# Patient Record
Sex: Male | Born: 1980 | Hispanic: Yes | Marital: Single | Smoking: Current some day smoker
Health system: Southern US, Community
[De-identification: ages and names within clinical notes are randomized; demographics above are authoritative.]

## PROBLEM LIST (undated history)

## (undated) DIAGNOSIS — F32A Depression, unspecified: Secondary | ICD-10-CM

## (undated) DIAGNOSIS — F419 Anxiety disorder, unspecified: Secondary | ICD-10-CM

## (undated) DIAGNOSIS — F329 Major depressive disorder, single episode, unspecified: Secondary | ICD-10-CM

## (undated) DIAGNOSIS — K219 Gastro-esophageal reflux disease without esophagitis: Secondary | ICD-10-CM

## (undated) HISTORY — DX: Gastro-esophageal reflux disease without esophagitis: K21.9

## (undated) HISTORY — DX: Depression, unspecified: F32.A

## (undated) HISTORY — DX: Anxiety disorder, unspecified: F41.9

## (undated) HISTORY — DX: Major depressive disorder, single episode, unspecified: F32.9

---

## 2014-07-19 ENCOUNTER — Emergency Department (HOSPITAL_COMMUNITY): Payer: Self-pay

## 2014-07-19 ENCOUNTER — Emergency Department (HOSPITAL_COMMUNITY)
Admission: EM | Admit: 2014-07-19 | Discharge: 2014-07-19 | Discharge status: Against Medical Advice | Payer: Self-pay | Attending: Emergency Medicine | Admitting: Emergency Medicine

## 2014-07-19 ENCOUNTER — Encounter (HOSPITAL_COMMUNITY): Payer: Self-pay | Admitting: Emergency Medicine

## 2014-07-19 DIAGNOSIS — F1010 Alcohol abuse, uncomplicated: Secondary | ICD-10-CM | POA: Insufficient documentation

## 2014-07-19 DIAGNOSIS — D696 Thrombocytopenia, unspecified: Secondary | ICD-10-CM | POA: Insufficient documentation

## 2014-07-19 DIAGNOSIS — F1410 Cocaine abuse, uncomplicated: Secondary | ICD-10-CM | POA: Insufficient documentation

## 2014-07-19 DIAGNOSIS — F1020 Alcohol dependence, uncomplicated: Secondary | ICD-10-CM | POA: Diagnosis present

## 2014-07-19 LAB — ACETAMINOPHEN LEVEL: Acetaminophen (Tylenol), Serum: <10 ug/mL — ABNORMAL LOW (ref 10–30)

## 2014-07-19 LAB — URINE MICROSCOPIC-ADD ON

## 2014-07-19 LAB — RAPID URINE DRUG SCREEN, HOSP PERFORMED
Amphetamines: NOT DETECTED
BENZODIAZEPINES: NOT DETECTED
Barbiturates: NOT DETECTED
COCAINE: POSITIVE — AB
Opiates: NOT DETECTED
TETRAHYDROCANNABINOL: NOT DETECTED

## 2014-07-19 LAB — COMPREHENSIVE METABOLIC PANEL
ALK PHOS: 117 U/L (ref 38–126)
ALT: 49 U/L (ref 17–63)
AST: 168 U/L — AB (ref 15–41)
Albumin: 3.6 g/dL (ref 3.5–5.0)
Anion gap: 10 (ref 5–15)
BILIRUBIN TOTAL: 1.4 mg/dL — AB (ref 0.3–1.2)
BUN: 6 mg/dL (ref 6–20)
CO2: 25 mmol/L (ref 22–32)
Calcium: 8.1 mg/dL — ABNORMAL LOW (ref 8.9–10.3)
Chloride: 103 mmol/L (ref 101–111)
Creatinine, Ser: 0.59 mg/dL — ABNORMAL LOW (ref 0.61–1.24)
GFR calc Af Amer: >60 mL/min (ref >60)
GFR calc non Af Amer: >60 mL/min (ref >60)
GLUCOSE: 105 mg/dL — AB (ref 70–99)
POTASSIUM: 3.5 mmol/L (ref 3.5–5.1)
SODIUM: 138 mmol/L (ref 135–145)
Total Protein: 8.7 g/dL — ABNORMAL HIGH (ref 6.5–8.1)

## 2014-07-19 LAB — CBC WITH DIFFERENTIAL/PLATELET
Basophils Absolute: 0.1 10*3/uL (ref 0.0–0.1)
Basophils Relative: 1 % (ref 0–1)
Eosinophils Absolute: 0.2 10*3/uL (ref 0.0–0.7)
Eosinophils Relative: 3 % (ref 0–5)
HCT: 35.3 % — ABNORMAL LOW (ref 39.0–52.0)
Hemoglobin: 11.4 g/dL — ABNORMAL LOW (ref 13.0–17.0)
Lymphocytes Relative: 22 % (ref 12–46)
Lymphs Abs: 1.3 10*3/uL (ref 0.7–4.0)
MCH: 23.2 pg — ABNORMAL LOW (ref 26.0–34.0)
MCHC: 32.3 g/dL (ref 30.0–36.0)
MCV: 71.9 fL — ABNORMAL LOW (ref 78.0–100.0)
MONOS PCT: 12 % (ref 3–12)
Monocytes Absolute: 0.7 10*3/uL (ref 0.1–1.0)
Neutro Abs: 3.4 10*3/uL (ref 1.7–7.7)
Neutrophils Relative %: 62 % (ref 43–77)
PLATELETS: 26 10*3/uL — AB (ref 150–400)
RBC: 4.91 MIL/uL (ref 4.22–5.81)
RDW: 17.4 % — AB (ref 11.5–15.5)
WBC: 5.7 10*3/uL (ref 4.0–10.5)

## 2014-07-19 LAB — ETHANOL: Alcohol, Ethyl (B): 316 mg/dL (ref <5)

## 2014-07-19 LAB — URINALYSIS, ROUTINE W REFLEX MICROSCOPIC
Glucose, UA: NEGATIVE mg/dL
HGB URINE DIPSTICK: NEGATIVE
KETONES UR: NEGATIVE mg/dL
LEUKOCYTES UA: NEGATIVE
Nitrite: NEGATIVE
PH: 6.5 (ref 5.0–8.0)
PROTEIN: 100 mg/dL — AB
SPECIFIC GRAVITY, URINE: 1.024 (ref 1.005–1.030)
Urobilinogen, UA: 2 mg/dL — ABNORMAL HIGH (ref 0.0–1.0)

## 2014-07-19 LAB — PROTIME-INR
INR: 1.41 (ref 0.00–1.49)
PROTHROMBIN TIME: 17.4 s — AB (ref 11.6–15.2)

## 2014-07-19 LAB — APTT: aPTT: 38 seconds — ABNORMAL HIGH (ref 24–37)

## 2014-07-19 LAB — LIPASE, BLOOD: LIPASE: 41 U/L (ref 22–51)

## 2014-07-19 LAB — SALICYLATE LEVEL

## 2014-07-19 MED ORDER — IBUPROFEN 200 MG PO TABS: 600.0000 mg | ORAL_TABLET | Freq: Three times a day (TID) | ORAL | Status: DC | PRN

## 2014-07-19 MED ORDER — ALUM & MAG HYDROXIDE-SIMETH 200-200-20 MG/5ML PO SUSP: 30.0000 mL | ORAL | Status: DC | PRN

## 2014-07-19 MED ORDER — ZOLPIDEM TARTRATE 5 MG PO TABS: 5.0000 mg | ORAL_TABLET | Freq: Every evening | ORAL | Status: DC | PRN

## 2014-07-19 MED ORDER — LORAZEPAM 1 MG PO TABS: 0.0000 mg | ORAL_TABLET | Freq: Four times a day (QID) | ORAL | Status: DC

## 2014-07-19 MED ORDER — THIAMINE HCL 100 MG/ML IJ SOLN
Freq: Once | INTRAVENOUS | Status: AC
  Administered 2014-07-19: 10:00:00 via INTRAVENOUS
  Filled 2014-07-19: qty 1000

## 2014-07-19 MED ORDER — LORAZEPAM 1 MG PO TABS: 0.0000 mg | ORAL_TABLET | Freq: Two times a day (BID) | ORAL | Status: DC

## 2014-07-19 MED ORDER — ONDANSETRON HCL 4 MG PO TABS: 4.0000 mg | ORAL_TABLET | Freq: Three times a day (TID) | ORAL | Status: DC | PRN

## 2014-07-19 MED ORDER — NICOTINE 21 MG/24HR TD PT24: 21.0000 mg | MEDICATED_PATCH | Freq: Every day | TRANSDERMAL | Status: DC

## 2014-07-19 NOTE — ED Notes (Signed)
RN notified of critical ETOH of 316.

## 2014-07-19 NOTE — Discharge Instructions (Signed)
Su recuento de Programme researcher, broadcasting/film/videoplaquetas hoy en da es muy baja , es probable que la causa de la totalidad de su sangrado . Es [employer] importante que siga en marcha para una evaluacin adicional de [place] sangrado . Por favor, pngase en contacto con el Dr. Bertis RuddyGorsuch en [address] [place] [id number] [phone]  Si las heces se ve oscura , sangrienta , tiene dolor abdominal o trauma en la cabeza debe venir inmediatamente a la sala de emergencias para una evaluacin adicional ya que esto puede resultar en una prdida de sangre que amenaza la vida .  Your platelet count today is very low, this is likely the cause of all of your bleeding. It is very important that you follow-up for further evaluation of this bleeding. Please contact Dr. Bertis RuddyGorsuch at [address] [place] [phone]  if your stool looks dark,bloody, you have any abdominal pain or head trauma you must immediately come to the emergency room for further evaluation as this may result in a life-threatening blood loss.

## 2014-07-19 NOTE — BH Assessment (Signed)
Assessment Note  Max FettersJuan Torres Lin is an 34 y.o. male complaining of increased anxiety. Patient reports that this morning, "I felt desperate". Sts that he started to throw things b/c, "Something is wrong in my head". Patient describes symptoms as fast heart beats, weak legs, memory loss, and nervousness around large crowds of people. He reports a similar episode 1 [place] ago in [place]. Sts that he was working and suddenly started to feel anxious and desperate. Patient stating that he feels suicidal due to this overwhelming feeling of anxiety and desperation. He doesn't feel safe to leave the hospital requesting to be further checked out medically. Patient feels stressed stating, "My bladder hurts and I bleed when I have a stool". Patient reports feeling depressed evidenced by loss of interest in usual pleasures, fatigue, isolating self from others, and hopelessness. Patient denies HI and AVH's. Although, he denies AVH's he has experienced AH's in the past (last incident was 6 months ago). He does not have any current mental health outpatient providers. He also does not have a history of inpatient mental health treatment. Patient has a close friend that he considers to be his support system. He lives with 2 roommates. He denies history of family members with mental illness. No history of abuse.  Axis I: Anxiety Disorder NOS and Depressive Disorder NOS Axis II: Deferred Axis III: History reviewed. No pertinent past medical history. Axis IV: other psychosocial or environmental problems, problems related to social environment, problems with access to health care services and problems with primary support group Axis V: 31-40 impairment in reality testing  Past Medical History: History reviewed. No pertinent past medical history.  History reviewed. No pertinent past surgical history.  Family History: No family history on file.  Social History:  reports that he drinks alcohol. He reports that he uses  illicit drugs (Cocaine). His tobacco history is not on file.  Additional Social History:  Alcohol / Drug Use Pain Medications: SEE MAR Prescriptions: SEE MAR Over the Counter: SEE MAR History of alcohol / drug use?: Yes Substance #1 Name of Substance 1: Alcohol  1 - Age of First Use: 34 yrs old  1 - Amount (size/oz): (4) 24 oz beers 1 - Frequency: daily  1 - Duration: on-going  1 - Last Use / Amount: last night 07/20/2014 (12 beers) Substance #2 Name of Substance 2: Cocaine  2 - Age of First Use: 34 yrs old  2 - Amount (size/oz): 1 gram 2 - Frequency: "Weekends" 2 - Duration: on-going  2 - Last Use / Amount: 07/19/2014  CIWA: CIWA-Ar BP: (!) 120/54 mmHg Pulse Rate: 93 Nausea and Vomiting: 2 Tactile Disturbances: none Tremor: no tremor Auditory Disturbances: not present Paroxysmal Sweats: no sweat visible Visual Disturbances: not present Anxiety: no anxiety, at ease Headache, Fullness in Head: none present Agitation: normal activity Orientation and Clouding of Sensorium: oriented and can do serial additions CIWA-Ar Total: 2 COWS:    Allergies: No Known Allergies  Home Medications:  (Not in a hospital admission)  OB/GYN Status:  No LMP for male patient.  General Assessment Data Location of Assessment: [facility] Is this a Tele or Face-to-Face Assessment?: Face-to-Face Is this an Initial Assessment or a Re-assessment for this encounter?: Initial Assessment Marital status: Divorced DavyMaiden name:  (n/a) Is patient pregnant?: No Pregnancy Status: No Living Arrangements: Non-relatives/Friends, Other (Comment) ("I live with 2 friends") Can pt return to current living arrangement?: Yes Admission Status: Voluntary Is patient capable of signing voluntary admission?: Yes Referral Source:  Self/Family/Friend Insurance type:  (Self Pay )  Medical Screening Exam [facility] Walk-in ONLY) Medical Exam completed:  (n/a)  Crisis Care Plan Living Arrangements: Non-relatives/Friends, Other  (Comment) ("I live with 2 friends") Name of Psychiatrist:  (patient denies ) Name of Therapist:  (patient denies )  Education Status Is patient currently in school?: No Current Grade:  (n/a) Highest grade of school patient has completed:  (n/a) Name of school:  (n/a) Contact person:  (n/a)  Risk to self with the past 6 months Suicidal Ideation: Yes-Currently Present Has patient been a risk to self within the past 6 months prior to admission? : No Suicidal Intent: No Has patient had any suicidal intent within the past 6 months prior to admission? : No Is patient at risk for suicide?: No Suicidal Plan?: No Has patient had any suicidal plan within the past 6 months prior to admission? : No Access to Means: No What has been your use of drugs/alcohol within the last 12 months?:  (patient reports cocaine and alcohol ) Previous Attempts/Gestures: No How many times?:  (0) Other Self Harm Risks:  (n/a) Triggers for Past Attempts: Other (Comment) (no previous attempts or gestures to harm self ) Intentional Self Injurious Behavior: None Family Suicide History: No Recent stressful life event(s): Other (Comment) ("My stomach hurts and I bleed when I use the bathroom") Persecutory voices/beliefs?: No Depression: Yes Depression Symptoms: Feeling angry/irritable, Feeling worthless/self pity, Guilt, Loss of interest in usual pleasures, Fatigue, Isolating, Tearfulness, Insomnia, Despondent Substance abuse history and/or treatment for substance abuse?: No Suicide prevention information given to non-admitted patients: Not applicable  Risk to Others within the past 6 months Homicidal Ideation: No Does patient have any lifetime risk of violence toward others beyond the six months prior to admission? : No Thoughts of Harm to Others: No Current Homicidal Intent: No Current Homicidal Plan: No Access to Homicidal Means: No Identified Victim:  (n/a) History of harm to others?: No Assessment of  Violence: None Noted Violent Behavior Description:  (patient calm and cooperative ) Does patient have access to weapons?: No Criminal Charges Pending?: No Does patient have a court date: No Is patient on probation?: No  Psychosis Hallucinations: Auditory (patient has a history  of auditory-last incident 6 mo's ago ) Delusions: None noted  Mental Status Report Appearance/Hygiene: Disheveled Eye Contact: Good Motor Activity: Freedom of movement Speech: Logical/coherent Level of Consciousness: Alert Mood: Depressed Affect: Appropriate to circumstance Anxiety Level: None Thought Processes: Relevant Judgement: Impaired Orientation: Person, Place, Time, Situation Obsessive Compulsive Thoughts/Behaviors: None  Cognitive Functioning Concentration: Normal Memory: Remote Intact, Recent Intact IQ: Average Insight: Good Impulse Control: Fair Appetite: Poor Weight Loss:  (pt admits to wt. loss but unable to provide a #) Weight Gain:  (none reported ) Sleep: Decreased Total Hours of Sleep:  ("I'm not getting any sleep") Vegetative Symptoms: None  ADLScreening [facility] Assessment Services) Patient's cognitive ability adequate to safely complete daily activities?: Yes Patient able to express need for assistance with ADLs?: Yes Independently performs ADLs?: Yes (appropriate for developmental age)  Prior Inpatient Therapy Prior Inpatient Therapy: No Prior Therapy Dates:  (n/a) Prior Therapy Facilty/Provider(s):  (n/a) Reason for Treatment:  (n/a)  Prior Outpatient Therapy Prior Outpatient Therapy: No Prior Therapy Dates:  (n/a) Prior Therapy Facilty/Provider(s):  (n/a) Reason for Treatment:  (n/a) Does patient have an ACCT team?: No Does patient have Intensive In-House Services?  : No Does patient have [facility] services? : No Does patient have P4CC services?: No  ADL Screening (condition  at time of admission) Patient's cognitive ability adequate to safely complete daily  activities?: Yes Is the patient deaf or have difficulty hearing?: No Does the patient have difficulty seeing, even when wearing glasses/contacts?: No Does the patient have difficulty concentrating, remembering, or making decisions?: No Patient able to express need for assistance with ADLs?: Yes Does the patient have difficulty dressing or bathing?: No Independently performs ADLs?: Yes (appropriate for developmental age) Does the patient have difficulty walking or climbing stairs?: No Weakness of Legs: None Weakness of Arms/Hands: None  Home Assistive Devices/Equipment Home Assistive Devices/Equipment: None    Abuse/Neglect Assessment (Assessment to be complete while patient is alone) Physical Abuse: Denies Verbal Abuse: Denies Sexual Abuse: Denies Exploitation of patient/patient's resources: Denies Self-Neglect: Denies Values / Beliefs Cultural Requests During Hospitalization: None Spiritual Requests During Hospitalization: None        Additional Information 1:1 In Past 12 Months?: No CIRT Risk: No Elopement Risk: No Does patient have medical clearance?: Yes     Disposition:  Disposition Initial Assessment Completed for this Encounter: Yes Disposition of Patient: Other dispositions (Pending psychiatric evaluation )  On Site Evaluation by:   Reviewed with Physician:    Octaviano BattyPerry, Kristeen Lantz Mona 07/19/2014 10:26 AM

## 2014-07-19 NOTE — Progress Notes (Signed)
Spanish interpreter to assist with psych evaluation  Between 930 and 10am.   Olga CoasterKristen Brysyn Brandenberger, LCSW  Clinical Social Work  [facility] [phone]

## 2014-07-19 NOTE — ED Notes (Signed)
Bed: WA20 Expected date:  Expected time:  Means of arrival:  Comments: headache

## 2014-07-19 NOTE — ED Notes (Signed)
Went in to check in room on pt and pt was not in room, tech said pt went to restroom before. Pt had no belongings in room, gown and paper scrubs left in room on bedside table. Search around ED, lobby and outside. Pt no where to be found. Security and off duty [employer] made aware as well as Press photographercharge nurse and AD. Pt still had IV in place when in room and no IV found in room

## 2014-07-19 NOTE — ED Notes (Signed)
RN and PA notified of critical Platelet count of 26.

## 2014-07-19 NOTE — ED Provider Notes (Signed)
CSN: [id number]     Arrival date & time 07/19/14  0800 History   First MD Initiated Contact with Patient 07/19/14 [phone]     Chief Complaint  Patient presents with  . Dizziness     (Consider location/radiation/quality/duration/timing/severity/associated sxs/prior Treatment) Patient is a 34 y.o. male presenting with dizziness. A language interpreter was used.  Dizziness    Lars MageJuan Raymon Muttonorres Salazar is a 34 y.o. male complaining of anxiety and vague suicidal ideation without a plan. Patient states that he had a similar episode one year ago, there is no particular life stressors there is setting this off. Patient works and he drinks a daily after work, states that he has about 324 ounce beers every day, states that he gets shaky when he does not drink that he denies any history of seizures or visual hallucinations from withdrawals. He occasionally uses cocaine. Denies prior suicide attempt, plan, homicidal ideation, auditory or visual hallucinations. States that when he gets stressed and anxious he thinks the only way to make it stop this to kill himself. Denies any history of liver issues but states that he bleeds easily, states that when he wakes up he has blood in his mouth. Denies hemoptysis and hematemesis Last drink was last night. He denies any recent head trauma.   History reviewed. No pertinent past medical history. History reviewed. No pertinent past surgical history. No family history on file. History  Substance Use Topics  . Smoking status: Not on file  . Smokeless tobacco: Not on file  . Alcohol Use: Yes    Review of Systems  10 systems reviewed and found to be negative, except as noted in the HPI.  Allergies  Review of patient's allergies indicates no known allergies.  Home Medications   Prior to Admission medications   Not on File   BP 118/70 mmHg  Pulse 70  Temp(Src) 98 F (36.7 C) (Oral)  Resp 17  SpO2 97% Physical Exam  Constitutional: He is oriented to person,  place, and time. He appears well-developed and well-nourished. No distress.  HENT:  Head: Normocephalic and atraumatic.  Mouth/Throat: Oropharynx is clear and moist.  Eyes: Conjunctivae and EOM are normal. Pupils are equal, round, and reactive to light.  Neck: Normal range of motion.  No midline C-spine  tenderness to palpation or step-offs appreciated. Patient has full range of motion without pain.   Cardiovascular: Normal rate, regular rhythm and intact distal pulses.   Pulmonary/Chest: Effort normal and breath sounds normal.  Abdominal: Soft. Bowel sounds are normal. He exhibits no distension and no mass. There is no tenderness. There is no rebound and no guarding.  Musculoskeletal: Normal range of motion.  Neurological: He is alert and oriented to person, place, and time.  Positive tongue fasciculations positive bilateral asterixis  Follows commands, Clear, goal oriented speech, Strength is 5 out of 5x4 extremities, patient ambulates with a coordinated in nonantalgic gait. Sensation is grossly intact.   Skin: Skin is warm. He is not diaphoretic.  Nursing note and vitals reviewed.   ED Course  Procedures (including critical care time) Labs Review Labs Reviewed  CBC WITH DIFFERENTIAL/PLATELET - Abnormal; Notable for the following:    Hemoglobin 11.4 (*)    HCT 35.3 (*)    MCV 71.9 (*)    MCH 23.2 (*)    RDW 17.4 (*)    Platelets 26 (*)    All other components within normal limits  COMPREHENSIVE METABOLIC PANEL - Abnormal; Notable for the following:  Glucose, Bld 105 (*)    Creatinine, Ser 0.59 (*)    Calcium 8.1 (*)    Total Protein 8.7 (*)    AST 168 (*)    Total Bilirubin 1.4 (*)    All other components within normal limits  PROTIME-INR - Abnormal; Notable for the following:    Prothrombin Time 17.4 (*)    All other components within normal limits  APTT - Abnormal; Notable for the following:    aPTT 38 (*)    All other components within normal limits  URINALYSIS,  ROUTINE W REFLEX MICROSCOPIC - Abnormal; Notable for the following:    Color, Urine AMBER (*)    Bilirubin Urine SMALL (*)    Protein, ur 100 (*)    Urobilinogen, UA 2.0 (*)    All other components within normal limits  URINE RAPID DRUG SCREEN (HOSP PERFORMED) - Abnormal; Notable for the following:    Cocaine POSITIVE (*)    All other components within normal limits  ETHANOL - Abnormal; Notable for the following:    Alcohol, Ethyl (B) 316 (*)    All other components within normal limits  ACETAMINOPHEN LEVEL - Abnormal; Notable for the following:    Acetaminophen (Tylenol), Serum <10 (*)    All other components within normal limits  LIPASE, BLOOD  SALICYLATE LEVEL  URINE MICROSCOPIC-ADD ON    Imaging Review Ct Head Wo Contrast  07/19/2014   CLINICAL DATA:  Dizziness  EXAM: CT HEAD WITHOUT CONTRAST  TECHNIQUE: Contiguous axial images were obtained from the base of the skull through the vertex without intravenous contrast.  COMPARISON:  None.  FINDINGS: No skull fracture is noted. Paranasal sinuses and mastoid air cells are unremarkable.  No intracranial hemorrhage, mass effect or midline shift. No hydrocephalus. No acute infarction. No mass lesion is noted on this unenhanced scan. The gray and white-matter differentiation is preserved. No intra or extra-axial fluid collection.  IMPRESSION: No acute intracranial abnormality.   Electronically Signed   By: Natasha MeadLiviu  Pop M.D.   On: 07/19/2014 09:17     EKG Interpretation None      MDM   Final diagnoses:  Thrombocytopenia  Alcohol abuse    Filed Vitals:   07/19/14 1015 07/19/14 1058 07/19/14 1154 07/19/14 1250  BP: 120/54 116/67 116/67 118/70  Pulse: 93 78 81 70  Temp:    98 F (36.7 C)  TempSrc:    Oral  Resp: 18 18  17   SpO2: 95% 96%  97%    Medications  LORazepam (ATIVAN) tablet 0-4 mg (0 mg Oral Not Given 07/19/14 1155)    Followed by  LORazepam (ATIVAN) tablet 0-4 mg (not administered)  LORazepam (ATIVAN) tablet 0-4 mg (0  mg Oral Duplicate 07/19/14 1157)    Followed by  LORazepam (ATIVAN) tablet 0-4 mg (not administered)  ibuprofen (ADVIL,MOTRIN) tablet 600 mg (not administered)  zolpidem (AMBIEN) tablet 5 mg (not administered)  nicotine (NICODERM CQ - dosed in mg/24 hours) patch 21 mg (not administered)  ondansetron (ZOFRAN) tablet 4 mg (not administered)  alum & mag hydroxide-simeth (MAALOX/MYLANTA) 200-200-20 MG/5ML suspension 30 mL (not administered)  sodium chloride 0.9 % 1,000 mL with thiamine 100 mg, folic acid 1 mg, multivitamins adult 10 mL, potassium chloride 20 mEq, magnesium sulfate 2 g infusion ( Intravenous New Bag/Given 07/19/14 0936)    Dennie FettersJuan Torres Salazar is a pleasant 34 y.o. male presenting with anxiety and vague suicidal ideation. Patient has no plan, no prior attempt. Elevation of AST to 168, PTT  is mildly elevated at 38. Normal INR 1.41. CBC shows a thrombocytopenia of 26,000. No significant active bleeding, head CT is negative. No anemia. Discussed with attending who recommends against transfusion, states patient can follow as an outpatient with hematology.   Alcohol level is elevated at greater than 300 however patient is not overtly intoxicated.  Patient is medically cleared for psychiatric evaluation will be transferred to the psych ED. TTS consulted, home meds and psych standard holding orders placed.   I have had an extensive discussion with this patient on his low platelet count and the importance of following up as an outpatient, patient is cleared for psychiatric evaluation, however, I have given the patient contact information for Dr. Bertis Ruddy now and also added to his discharge paperwork. We have had an extensive discussion of return precautions for trauma and GI bleeding. All questions are encouraged and answered.   Wynetta Emery, PA-C 07/19/14 1317  Vanetta Mulders, MD 07/21/14 1349

## 2014-07-19 NOTE — ED Notes (Addendum)
Per EMS: pt doe snot speak english, primarily spanish. Pt c/o dizziness, some n/v, pt also had dried blood on mouth. Pt also has ETOH on board. 4 mg of Zofran in route.

## 2014-07-20 DIAGNOSIS — Y909 Presence of alcohol in blood, level not specified: Secondary | ICD-10-CM

## 2014-07-20 DIAGNOSIS — F1020 Alcohol dependence, uncomplicated: Secondary | ICD-10-CM | POA: Diagnosis present

## 2014-07-20 NOTE — Consult Note (Signed)
[facility] Consult   Reason for Consult:  Alcohol use disorder, severe, Anxiety disorder by hx Referring Physician:  EDP Patient Identification: Max Lin MRN:  [id number] Principal Diagnosis: Alcohol use disorder, severe, dependence Diagnosis:   Patient Active Problem List   Diagnosis Date Noted  . Alcohol use disorder, severe, dependence [F10.20] 07/20/2014    Total Time spent with patient: 1 hour  Subjective:   Max Lin is a 34 y.o. male patient admitted with Alcohol use disorder, severe  HPI:  Hispanic male, 34 years old was evaluated yesterday morning for Alcohol use and intoxication.  His interview was conducted with the assistance of a Spanish interpreter.  Patient reported drinking 3 beers after work every day but drinks up to a case on week ends.  Patient also admitted to using Cocaine occasionally and his last use was the weekend.  Patient reported racing thought, lack of concentration and anxiety.  He reported previous admission ata hospital in [place] state for not concentrating, not comfortable staying around people.  At the time he was given medication to use as needed but could not remember the name.   Patient repeated touched his head as he described his lack of concentration and racing thoughts and stated that he does a lot of thinking that he cannot sleep.  He denied SI/HI/AVH.  Patient had a dirty shirt with blood on it and when asked what happened he stated that he was bleeding from his nose earlier.   Patient denied head injury in the last 6 months.  He reported good sleep and appetite.  We accepted patient for admission and will seek placement after he is cleared by the EDP  HPI Elements:   Location:  Alcohol use disorder, severe, . Quality:  severe, racing thought, anxiety, lack of concentration. Severity:  severe. Timing:  Acute. Duration:  Chronic. Context:  Brought self in seeking treatment for Alcohol intoxication..  Past Medical  History: History reviewed. No pertinent past medical history. History reviewed. No pertinent past surgical history. Family History: No family history on file. Social History:  History  Alcohol Use  . Yes     History  Drug Use  . Yes  . Special: Cocaine    History   Social History  . Marital Status: Single    Spouse Name: N/A  . Number of Children: N/A  . Years of Education: N/A   Social History Main Topics  . Smoking status: Not on file  . Smokeless tobacco: Not on file  . Alcohol Use: Yes  . Drug Use: Yes    Special: Cocaine  . Sexual Activity: Not on file   Other Topics Concern  . None   Social History Narrative  . None   Additional Social History:    Pain Medications: SEE MAR Prescriptions: SEE MAR Over the Counter: SEE MAR History of alcohol / drug use?: Yes Name of Substance 1: Alcohol  1 - Age of First Use: 34 yrs old  1 - Amount (size/oz): (4) 24 oz beers 1 - Frequency: daily  1 - Duration: on-going  1 - Last Use / Amount: last night 07/20/2014 (12 beers) Name of Substance 2: Cocaine  2 - Age of First Use: 34 yrs old  2 - Amount (size/oz): 1 gram 2 - Frequency: "Weekends" 2 - Duration: on-going  2 - Last Use / Amount: 07/19/2014                 Allergies:  No Known  Allergies  Labs:  Results for orders placed or performed during the hospital encounter of 07/19/14 (from the past 48 hour(s))  CBC with Differential     Status: Abnormal   Collection Time: 07/19/14  8:49 AM  Result Value Ref Range   WBC 5.7 4.0 - 10.5 K/uL   RBC 4.91 4.22 - 5.81 MIL/uL   Hemoglobin 11.4 (L) 13.0 - 17.0 g/dL   HCT 35.3 (L) 39.0 - 52.0 %   MCV 71.9 (L) 78.0 - 100.0 fL   MCH 23.2 (L) 26.0 - 34.0 pg   MCHC 32.3 30.0 - 36.0 g/dL   RDW 17.4 (H) 11.5 - 15.5 %   Platelets 26 (LL) 150 - 400 K/uL    Comment: SPECIMEN CHECKED FOR CLOTS REPEATED TO VERIFY PLATELET COUNT CONFIRMED BY SMEAR CRITICAL RESULT CALLED TO, READ BACK BY AND VERIFIED WITH: A.NEASE RN AT 0950 ON  05.04.16 BY SHUEA    Neutrophils Relative % 62 43 - 77 %   Lymphocytes Relative 22 12 - 46 %   Monocytes Relative 12 3 - 12 %   Eosinophils Relative 3 0 - 5 %   Basophils Relative 1 0 - 1 %   Neutro Abs 3.4 1.7 - 7.7 K/uL   Lymphs Abs 1.3 0.7 - 4.0 K/uL   Monocytes Absolute 0.7 0.1 - 1.0 K/uL   Eosinophils Absolute 0.2 0.0 - 0.7 K/uL   Basophils Absolute 0.1 0.0 - 0.1 K/uL   RBC Morphology TARGET CELLS   Comprehensive metabolic panel     Status: Abnormal   Collection Time: 07/19/14  8:49 AM  Result Value Ref Range   Sodium 138 135 - 145 mmol/L   Potassium 3.5 3.5 - 5.1 mmol/L   Chloride 103 101 - 111 mmol/L   CO2 25 22 - 32 mmol/L   Glucose, Bld 105 (H) 70 - 99 mg/dL   BUN 6 6 - 20 mg/dL   Creatinine, Ser 0.59 (L) 0.61 - 1.24 mg/dL   Calcium 8.1 (L) 8.9 - 10.3 mg/dL   Total Protein 8.7 (H) 6.5 - 8.1 g/dL   Albumin 3.6 3.5 - 5.0 g/dL   AST 168 (H) 15 - 41 U/L   ALT 49 17 - 63 U/L   Alkaline Phosphatase 117 38 - 126 U/L   Total Bilirubin 1.4 (H) 0.3 - 1.2 mg/dL   GFR calc non Af Amer >60 >60 mL/min   GFR calc Af Amer >60 >60 mL/min    Comment: (NOTE) The eGFR has been calculated using the CKD EPI equation. This calculation has not been validated in all clinical situations. eGFR's persistently <90 mL/min signify possible Chronic Kidney Disease.    Anion gap 10 5 - 15  Lipase, blood     Status: None   Collection Time: 07/19/14  8:49 AM  Result Value Ref Range   Lipase 41 22 - 51 U/L  Protime-INR     Status: Abnormal   Collection Time: 07/19/14  8:49 AM  Result Value Ref Range   Prothrombin Time 17.4 (H) 11.6 - 15.2 seconds   INR 1.41 0.00 - 1.49  APTT     Status: Abnormal   Collection Time: 07/19/14  8:49 AM  Result Value Ref Range   aPTT 38 (H) 24 - 37 seconds    Comment:        IF BASELINE aPTT IS ELEVATED, SUGGEST PATIENT RISK ASSESSMENT BE USED TO DETERMINE APPROPRIATE ANTICOAGULANT THERAPY.   Urinalysis, Routine w reflex microscopic  Status: Abnormal    Collection Time: 07/19/14  8:49 AM  Result Value Ref Range   Color, Urine AMBER (A) YELLOW    Comment: BIOCHEMICALS MAY BE AFFECTED BY COLOR   APPearance CLEAR CLEAR   Specific Gravity, Urine 1.024 1.005 - 1.030   pH 6.5 5.0 - 8.0   Glucose, UA NEGATIVE NEGATIVE mg/dL   Hgb urine dipstick NEGATIVE NEGATIVE   Bilirubin Urine SMALL (A) NEGATIVE   Ketones, ur NEGATIVE NEGATIVE mg/dL   Protein, ur 100 (A) NEGATIVE mg/dL   Urobilinogen, UA 2.0 (H) 0.0 - 1.0 mg/dL   Nitrite NEGATIVE NEGATIVE   Leukocytes, UA NEGATIVE NEGATIVE  Drug screen panel, emergency     Status: Abnormal   Collection Time: 07/19/14  8:49 AM  Result Value Ref Range   Opiates NONE DETECTED NONE DETECTED   Cocaine POSITIVE (A) NONE DETECTED   Benzodiazepines NONE DETECTED NONE DETECTED   Amphetamines NONE DETECTED NONE DETECTED   Tetrahydrocannabinol NONE DETECTED NONE DETECTED   Barbiturates NONE DETECTED NONE DETECTED    Comment:        DRUG SCREEN FOR MEDICAL PURPOSES ONLY.  IF CONFIRMATION IS NEEDED FOR ANY PURPOSE, NOTIFY LAB WITHIN 5 DAYS.        LOWEST DETECTABLE LIMITS FOR URINE DRUG SCREEN Drug Class       Cutoff (ng/mL) Amphetamine      1000 Barbiturate      200 Benzodiazepine   703 Tricyclics       500 Opiates          300 Cocaine          300 THC              50   Ethanol     Status: Abnormal   Collection Time: 07/19/14  8:49 AM  Result Value Ref Range   Alcohol, Ethyl (B) 316 (HH) <5 mg/dL    Comment:        LOWEST DETECTABLE LIMIT FOR SERUM ALCOHOL IS 11 mg/dL FOR MEDICAL PURPOSES ONLY REPEATED TO VERIFY CRITICAL RESULT CALLED TO, READ BACK BY AND VERIFIED WITH: NEESE RN AT 1006 ON 5.4.16 BY MENDOZA B   Acetaminophen level     Status: Abnormal   Collection Time: 07/19/14  8:49 AM  Result Value Ref Range   Acetaminophen (Tylenol), Serum <10 (L) 10 - 30 ug/mL    Comment:        THERAPEUTIC CONCENTRATIONS VARY SIGNIFICANTLY. A RANGE OF 10-30 ug/mL MAY BE AN EFFECTIVE CONCENTRATION  FOR MANY PATIENTS. HOWEVER, SOME ARE BEST TREATED AT CONCENTRATIONS OUTSIDE THIS RANGE. ACETAMINOPHEN CONCENTRATIONS >150 ug/mL AT 4 HOURS AFTER INGESTION AND >50 ug/mL AT 12 HOURS AFTER INGESTION ARE OFTEN ASSOCIATED WITH TOXIC REACTIONS.   Salicylate level     Status: None   Collection Time: 07/19/14  8:49 AM  Result Value Ref Range   Salicylate Lvl <9.3 2.8 - 30.0 mg/dL  Urine microscopic-add on     Status: None   Collection Time: 07/19/14  8:49 AM  Result Value Ref Range   WBC, UA 0-2 <3 WBC/hpf   RBC / HPF 0-2 <3 RBC/hpf   Urine-Other MUCOUS PRESENT     Vitals: Blood pressure 118/70, pulse 70, temperature 98 F (36.7 C), temperature source Oral, resp. rate 17, SpO2 97 %.  Risk to Self: Suicidal Ideation: Yes-Currently Present Suicidal Intent: No Is patient at risk for suicide?: No Suicidal Plan?: No Access to Means: No What has been your use of drugs/alcohol within the  last 12 months?:  (patient reports cocaine and alcohol ) How many times?:  (0) Other Self Harm Risks:  (n/a) Triggers for Past Attempts: Other (Comment) (no previous attempts or gestures to harm self ) Intentional Self Injurious Behavior: None Risk to Others: Homicidal Ideation: No Thoughts of Harm to Others: No Current Homicidal Intent: No Current Homicidal Plan: No Access to Homicidal Means: No Identified Victim:  (n/a) History of harm to others?: No Assessment of Violence: None Noted Violent Behavior Description:  (patient calm and cooperative ) Does patient have access to weapons?: No Criminal Charges Pending?: No Does patient have a court date: No Prior Inpatient Therapy: Prior Inpatient Therapy: No Prior Therapy Dates:  (n/a) Prior Therapy Facilty/Provider(s):  (n/a) Reason for Treatment:  (n/a) Prior Outpatient Therapy: Prior Outpatient Therapy: No Prior Therapy Dates:  (n/a) Prior Therapy Facilty/Provider(s):  (n/a) Reason for Treatment:  (n/a) Does patient have an ACCT team?:  No Does patient have Intensive In-House Services?  : No Does patient have [facility] services? : No Does patient have [facility] services?: No  No current facility-administered medications for this encounter.   No current outpatient prescriptions on file.    Musculoskeletal: Strength & Muscle Tone: within normal limits Gait & Station: normal Patient leans: N/A  Psychiatric Specialty Exam:     Blood pressure 118/70, pulse 70, temperature 98 F (36.7 C), temperature source Oral, resp. rate 17, SpO2 97 %.There is no height or weight on file to calculate BMI.  General Appearance: Casual  Eye Contact::  Good  Speech:  Clear and Coherent, Normal Rate and vis spanish interpreter  Volume:  Normal  Mood:  Anxious  Affect:  Congruent  Thought Process:  Coherent, Goal Directed and Intact  Orientation:  Full (Time, Place, and Person)  Thought Content:  WDL  Suicidal Thoughts:  No  Homicidal Thoughts:  No  Memory:  Immediate;   Good Recent;   Fair Remote;   Fair  Judgement:  Fair  Insight:  Fair  Psychomotor Activity:  Normal  Concentration:  Good  Recall:  NA  Fund of Knowledge:Fair  Language: via interpreter  Akathisia:  NA  Handed:  Right  AIMS (if indicated):     Assets:  Desire for Improvement  ADL's:  Intact  Cognition: WNL  Sleep:      Medical Decision Making: Review of Psycho-Social Stressors (1), Established Problem, Worsening (2) and Review of Medication Regimen & Side Effects (2)  Treatment Plan Summary: Daily contact with patient to assess and evaluate symptoms and progress in treatment, Medication management and Plan We initiated our Atival  detox protocol.  Plan:  Recommend psychiatric Inpatient admission when medically cleared. Initiate our Ativan protocol for his Alcohol detox, Patient will be re-evaluated to determine his menntal diagosis based on hx of anxiety in [place] Disposition: see above  Delfin Gant   PMHNP-BC 07/20/2014 8:52 AM Patient seen  face-to-face for psychiatric evaluation, chart reviewed and case discussed with the physician extender and developed treatment plan. Reviewed the information documented and agree with the treatment plan. Corena Pilgrim, MD

## 2014-07-22 ENCOUNTER — Emergency Department (HOSPITAL_COMMUNITY): Payer: MEDICAID

## 2014-07-22 ENCOUNTER — Inpatient Hospital Stay (HOSPITAL_COMMUNITY): Payer: MEDICAID

## 2014-07-22 ENCOUNTER — Inpatient Hospital Stay (HOSPITAL_COMMUNITY)
Admission: EM | Admit: 2014-07-22 | Discharge: 2014-07-27 | DRG: 356 | Disposition: A | Discharge status: Home / Self Care | Payer: MEDICAID | Attending: Internal Medicine | Admitting: Internal Medicine

## 2014-07-22 ENCOUNTER — Encounter (HOSPITAL_COMMUNITY): Payer: Self-pay | Admitting: *Deleted

## 2014-07-22 ENCOUNTER — Encounter (HOSPITAL_COMMUNITY)
Admission: EM | Disposition: A | Discharge status: Home / Self Care | Payer: Self-pay | Source: Home / Self Care | Attending: Pulmonary Disease

## 2014-07-22 DIAGNOSIS — M549 Dorsalgia, unspecified: Secondary | ICD-10-CM | POA: Diagnosis present

## 2014-07-22 DIAGNOSIS — Z978 Presence of other specified devices: Secondary | ICD-10-CM

## 2014-07-22 DIAGNOSIS — R42 Dizziness and giddiness: Secondary | ICD-10-CM | POA: Diagnosis present

## 2014-07-22 DIAGNOSIS — F1010 Alcohol abuse, uncomplicated: Secondary | ICD-10-CM | POA: Diagnosis present

## 2014-07-22 DIAGNOSIS — R710 Precipitous drop in hematocrit: Secondary | ICD-10-CM | POA: Diagnosis present

## 2014-07-22 DIAGNOSIS — R579 Shock, unspecified: Secondary | ICD-10-CM

## 2014-07-22 DIAGNOSIS — K922 Gastrointestinal hemorrhage, unspecified: Secondary | ICD-10-CM | POA: Diagnosis present

## 2014-07-22 DIAGNOSIS — K3189 Other diseases of stomach and duodenum: Secondary | ICD-10-CM | POA: Diagnosis present

## 2014-07-22 DIAGNOSIS — E876 Hypokalemia: Secondary | ICD-10-CM | POA: Diagnosis present

## 2014-07-22 DIAGNOSIS — H538 Other visual disturbances: Secondary | ICD-10-CM | POA: Diagnosis present

## 2014-07-22 DIAGNOSIS — R571 Hypovolemic shock: Secondary | ICD-10-CM | POA: Diagnosis present

## 2014-07-22 DIAGNOSIS — J9690 Respiratory failure, unspecified, unspecified whether with hypoxia or hypercapnia: Secondary | ICD-10-CM

## 2014-07-22 DIAGNOSIS — M25551 Pain in right hip: Secondary | ICD-10-CM | POA: Diagnosis present

## 2014-07-22 DIAGNOSIS — R072 Precordial pain: Secondary | ICD-10-CM | POA: Diagnosis present

## 2014-07-22 DIAGNOSIS — M25559 Pain in unspecified hip: Secondary | ICD-10-CM | POA: Diagnosis present

## 2014-07-22 DIAGNOSIS — K769 Liver disease, unspecified: Secondary | ICD-10-CM

## 2014-07-22 DIAGNOSIS — M545 Low back pain: Secondary | ICD-10-CM | POA: Diagnosis present

## 2014-07-22 DIAGNOSIS — D62 Acute posthemorrhagic anemia: Secondary | ICD-10-CM | POA: Diagnosis present

## 2014-07-22 DIAGNOSIS — R51 Headache: Secondary | ICD-10-CM | POA: Diagnosis present

## 2014-07-22 DIAGNOSIS — M25552 Pain in left hip: Secondary | ICD-10-CM | POA: Diagnosis present

## 2014-07-22 DIAGNOSIS — J9600 Acute respiratory failure, unspecified whether with hypoxia or hypercapnia: Secondary | ICD-10-CM | POA: Diagnosis present

## 2014-07-22 DIAGNOSIS — K7010 Alcoholic hepatitis without ascites: Secondary | ICD-10-CM | POA: Diagnosis present

## 2014-07-22 DIAGNOSIS — Z452 Encounter for adjustment and management of vascular access device: Secondary | ICD-10-CM

## 2014-07-22 DIAGNOSIS — R000 Tachycardia, unspecified: Secondary | ICD-10-CM | POA: Diagnosis present

## 2014-07-22 DIAGNOSIS — R109 Unspecified abdominal pain: Secondary | ICD-10-CM

## 2014-07-22 DIAGNOSIS — J9811 Atelectasis: Secondary | ICD-10-CM | POA: Diagnosis present

## 2014-07-22 DIAGNOSIS — D696 Thrombocytopenia, unspecified: Secondary | ICD-10-CM | POA: Diagnosis present

## 2014-07-22 DIAGNOSIS — K8020 Calculus of gallbladder without cholecystitis without obstruction: Secondary | ICD-10-CM | POA: Diagnosis present

## 2014-07-22 DIAGNOSIS — M546 Pain in thoracic spine: Secondary | ICD-10-CM | POA: Diagnosis present

## 2014-07-22 DIAGNOSIS — I8501 Esophageal varices with bleeding: Principal | ICD-10-CM | POA: Diagnosis present

## 2014-07-22 DIAGNOSIS — F17210 Nicotine dependence, cigarettes, uncomplicated: Secondary | ICD-10-CM | POA: Diagnosis present

## 2014-07-22 DIAGNOSIS — K760 Fatty (change of) liver, not elsewhere classified: Secondary | ICD-10-CM | POA: Diagnosis present

## 2014-07-22 DIAGNOSIS — J90 Pleural effusion, not elsewhere classified: Secondary | ICD-10-CM | POA: Diagnosis present

## 2014-07-22 HISTORY — PX: ESOPHAGOGASTRODUODENOSCOPY: SHX5428

## 2014-07-22 LAB — CBC WITH DIFFERENTIAL/PLATELET
BASOS PCT: 1 % (ref 0–1)
Basophils Absolute: 0.1 10*3/uL (ref 0.0–0.1)
EOS ABS: 0.3 10*3/uL (ref 0.0–0.7)
Eosinophils Relative: 2 % (ref 0–5)
HCT: 27 % — ABNORMAL LOW (ref 39.0–52.0)
HEMOGLOBIN: 8.7 g/dL — AB (ref 13.0–17.0)
LYMPHS ABS: 1.8 10*3/uL (ref 0.7–4.0)
LYMPHS PCT: 14 % (ref 12–46)
MCH: 23.9 pg — ABNORMAL LOW (ref 26.0–34.0)
MCHC: 32.2 g/dL (ref 30.0–36.0)
MCV: 74.2 fL — ABNORMAL LOW (ref 78.0–100.0)
MONOS PCT: 10 % (ref 3–12)
Monocytes Absolute: 1.3 10*3/uL — ABNORMAL HIGH (ref 0.1–1.0)
NEUTROS PCT: 73 % (ref 43–77)
Neutro Abs: 9.1 10*3/uL — ABNORMAL HIGH (ref 1.7–7.7)
Platelets: 46 10*3/uL — ABNORMAL LOW (ref 150–400)
RBC: 3.64 MIL/uL — ABNORMAL LOW (ref 4.22–5.81)
RDW: 17.5 % — AB (ref 11.5–15.5)
WBC: 12.6 10*3/uL — AB (ref 4.0–10.5)

## 2014-07-22 LAB — COMPREHENSIVE METABOLIC PANEL
ALK PHOS: 109 U/L (ref 38–126)
ALT: 41 U/L (ref 17–63)
AST: 103 U/L — AB (ref 15–41)
Albumin: 2.7 g/dL — ABNORMAL LOW (ref 3.5–5.0)
Anion gap: 7 (ref 5–15)
BILIRUBIN TOTAL: 1.6 mg/dL — AB (ref 0.3–1.2)
BUN: 17 mg/dL (ref 6–20)
CALCIUM: 7.7 mg/dL — AB (ref 8.9–10.3)
CO2: 23 mmol/L (ref 22–32)
Chloride: 106 mmol/L (ref 101–111)
Creatinine, Ser: 0.89 mg/dL (ref 0.61–1.24)
GLUCOSE: 144 mg/dL — AB (ref 70–99)
Potassium: 3.7 mmol/L (ref 3.5–5.1)
Sodium: 136 mmol/L (ref 135–145)
Total Protein: 6.7 g/dL (ref 6.5–8.1)

## 2014-07-22 LAB — I-STAT CHEM 8, ED
BUN: 17 mg/dL (ref 6–20)
CALCIUM ION: 1.09 mmol/L — AB (ref 1.12–1.23)
Chloride: 102 mmol/L (ref 101–111)
Creatinine, Ser: 1 mg/dL (ref 0.61–1.24)
Glucose, Bld: 141 mg/dL — ABNORMAL HIGH (ref 70–99)
HEMATOCRIT: 32 % — AB (ref 39.0–52.0)
HEMOGLOBIN: 10.9 g/dL — AB (ref 13.0–17.0)
Potassium: 3.7 mmol/L (ref 3.5–5.1)
SODIUM: 137 mmol/L (ref 135–145)
TCO2: 20 mmol/L (ref 0–100)

## 2014-07-22 LAB — ETHANOL: Alcohol, Ethyl (B): <5 mg/dL (ref <5)

## 2014-07-22 LAB — PROTIME-INR
INR: 1.68 — ABNORMAL HIGH (ref 0.00–1.49)
PROTHROMBIN TIME: 20 s — AB (ref 11.6–15.2)

## 2014-07-22 LAB — APTT: aPTT: 32 seconds (ref 24–37)

## 2014-07-22 LAB — PREPARE RBC (CROSSMATCH)

## 2014-07-22 LAB — ABO/RH: ABO/RH(D): O POS

## 2014-07-22 SURGERY — EGD (ESOPHAGOGASTRODUODENOSCOPY)
Anesthesia: Moderate Sedation

## 2014-07-22 MED ORDER — SUCCINYLCHOLINE CHLORIDE 20 MG/ML IJ SOLN
INTRAMUSCULAR | Status: AC
  Filled 2014-07-22: qty 1

## 2014-07-22 MED ORDER — LIDOCAINE HCL (PF) 1 % IJ SOLN
INTRAMUSCULAR | Status: AC
  Filled 2014-07-22: qty 5

## 2014-07-22 MED ORDER — SODIUM CHLORIDE 0.9 % IV BOLUS (SEPSIS)
1000.0000 mL | Freq: Once | INTRAVENOUS | Status: AC
  Administered 2014-07-22: 1000 mL via INTRAVENOUS

## 2014-07-22 MED ORDER — DEXTROSE 5 % IV SOLN
2.0000 ug/min | INTRAVENOUS | Status: DC
  Administered 2014-07-22: 2 ug/min via INTRAVENOUS
  Administered 2014-07-23: 15 ug/min via INTRAVENOUS
  Filled 2014-07-22 (×2): qty 4

## 2014-07-22 MED ORDER — MIDAZOLAM HCL 5 MG/ML IJ SOLN
INTRAMUSCULAR | Status: AC
Start: 2014-07-22 — End: 2014-07-22
  Filled 2014-07-22: qty 2

## 2014-07-22 MED ORDER — PANTOPRAZOLE SODIUM 40 MG IV SOLR
80.0000 mg | Freq: Once | INTRAVENOUS | Status: AC
  Administered 2014-07-22: 80 mg via INTRAVENOUS
  Filled 2014-07-22: qty 80

## 2014-07-22 MED ORDER — SODIUM CHLORIDE 0.9 % IV SOLN
10.0000 mL/h | Freq: Once | INTRAVENOUS | Status: AC
  Administered 2014-07-22: 500 mL/h via INTRAVENOUS

## 2014-07-22 MED ORDER — SODIUM CHLORIDE 0.9 % IV SOLN
10.0000 mL/h | Freq: Once | INTRAVENOUS | Status: AC
  Administered 2014-07-22: 10 mL/h via INTRAVENOUS

## 2014-07-22 MED ORDER — FENTANYL CITRATE (PF) 100 MCG/2ML IJ SOLN
INTRAMUSCULAR | Status: AC
  Administered 2014-07-23: 150 ug
  Filled 2014-07-22: qty 6

## 2014-07-22 MED ORDER — FENTANYL CITRATE (PF) 100 MCG/2ML IJ SOLN
INTRAMUSCULAR | Status: AC
  Filled 2014-07-22: qty 2

## 2014-07-22 MED ORDER — SODIUM CHLORIDE 0.9 % IV SOLN
50.0000 ug/h | INTRAVENOUS | Status: DC
  Administered 2014-07-22 – 2014-07-26 (×9): 50 ug/h via INTRAVENOUS
  Filled 2014-07-22 (×20): qty 1

## 2014-07-22 MED ORDER — FENTANYL CITRATE (PF) 100 MCG/2ML IJ SOLN
50.0000 ug | Freq: Once | INTRAMUSCULAR | Status: AC
  Administered 2014-07-22: 50 ug via INTRAVENOUS
  Filled 2014-07-22: qty 2

## 2014-07-22 MED ORDER — DEXTROSE 5 % IV SOLN
2.0000 g | Freq: Once | INTRAVENOUS | Status: AC
  Administered 2014-07-23: 2 g via INTRAVENOUS
  Filled 2014-07-22: qty 2

## 2014-07-22 MED ORDER — ETOMIDATE 2 MG/ML IV SOLN
INTRAVENOUS | Status: AC
  Filled 2014-07-22: qty 20

## 2014-07-22 MED ORDER — LIDOCAINE HCL (CARDIAC) 20 MG/ML IV SOLN
INTRAVENOUS | Status: AC
  Filled 2014-07-22: qty 5

## 2014-07-22 MED ORDER — STERILE WATER FOR INJECTION IJ SOLN
INTRAMUSCULAR | Status: AC
  Filled 2014-07-22: qty 10

## 2014-07-22 MED ORDER — ROCURONIUM BROMIDE 50 MG/5ML IV SOLN
INTRAVENOUS | Status: AC
  Administered 2014-07-23: 10 mg
  Filled 2014-07-22: qty 2

## 2014-07-22 MED ORDER — ONDANSETRON HCL 4 MG/2ML IJ SOLN
4.0000 mg | Freq: Once | INTRAMUSCULAR | Status: AC
  Administered 2014-07-22: 4 mg via INTRAVENOUS
  Filled 2014-07-22: qty 2

## 2014-07-22 MED ORDER — MIDAZOLAM HCL 2 MG/2ML IJ SOLN
INTRAMUSCULAR | Status: AC
  Administered 2014-07-23: 4 mg
  Filled 2014-07-22: qty 8

## 2014-07-22 MED ORDER — OCTREOTIDE LOAD VIA INFUSION
50.0000 ug | Freq: Once | INTRAVENOUS | Status: DC
  Filled 2014-07-22: qty 25

## 2014-07-22 NOTE — ED Notes (Signed)
Bed: WA01 Expected date:  Expected time:  Means of arrival:  Comments: EMS 

## 2014-07-22 NOTE — ED Provider Notes (Signed)
CSN: [id number]     Arrival date & time 07/22/14  1838 History   First MD Initiated Contact with Patient 07/22/14 1852     Chief Complaint  Patient presents with  . Hematemesis  . Hypotension     (Consider location/radiation/quality/duration/timing/severity/associated sxs/prior Treatment) HPI  34 year old male presents by EMS with hematemesis. The patient is on up 3 times with blood today. The history taken through the Spanish interpreter phone. The patient is a poor historian but notes that he has been having blood in his emesis intermittently for several months but is significant worse today. The patient denies drinking every day, however he was seen here in this ER a couple days ago for alcohol detox and admitted to drinking heavily. The patient does not know of any prior history of ulcers or cirrhosis. Patient is complaining of upper abdominal pain, chest pain, and headache. He states the headache is been ongoing for the past 2 years intermittently but worse over the last few days. He's also been having blurry vision. The chest pain and abdominal pain have worsened with the vomiting. Occasionally has had melena in the past but denies any recently.  History reviewed. No pertinent past medical history. History reviewed. No pertinent past surgical history. No family history on file. History  Substance Use Topics  . Smoking status: Current Some Day Smoker  . Smokeless tobacco: Not on file  . Alcohol Use: Yes    Review of Systems  Constitutional: Negative for fever.  Eyes: Positive for visual disturbance.  Cardiovascular: Positive for chest pain.  Gastrointestinal: Positive for vomiting and abdominal pain. Negative for blood in stool.  Neurological: Positive for headaches.  All other systems reviewed and are negative.     Allergies  Review of patient's allergies indicates no known allergies.  Home Medications   Prior to Admission medications   Not on File   BP 92/46 mmHg   Pulse 89  Temp(Src) 98.1 F (36.7 C) (Oral)  Resp 18  SpO2 100% Physical Exam  Constitutional: He is oriented to person, place, and time. He appears well-developed and well-nourished.  HENT:  Head: Normocephalic and atraumatic.  Right Ear: External ear normal.  Left Ear: External ear normal.  Nose: Nose normal.  Eyes: Right eye exhibits no discharge. Left eye exhibits no discharge.  Neck: Neck supple.  Cardiovascular: Normal rate, regular rhythm, normal heart sounds and intact distal pulses.   Pulmonary/Chest: Effort normal.  Abdominal: Soft. There is no tenderness.  Musculoskeletal: He exhibits no edema.  Neurological: He is alert and oriented to person, place, and time.  Skin: Skin is warm and dry.  Nursing note and vitals reviewed.   ED Course  CENTRAL LINE Date/Time: 07/22/2014 10:12 PM Performed by: Pricilla LovelessGOLDSTON, Kharson Rasmusson Authorized by: Pricilla LovelessGOLDSTON, Tulsi Crossett Consent: Verbal consent obtained. Written consent obtained. Risks and benefits: risks, benefits and alternatives were discussed Consent given by: patient Time out: Immediately prior to procedure a "time out" was called to verify the correct patient, procedure, equipment, support staff and site/side marked as required. Indications: vascular access Anesthesia: local infiltration Local anesthetic: lidocaine 1% without epinephrine Patient sedated: no Preparation: skin prepped with ChloraPrep Skin prep agent dried: skin prep agent completely dried prior to procedure Sterile barriers: all five maximum sterile barriers used - cap, mask, sterile gown, sterile gloves, and large sterile sheet Hand hygiene: hand hygiene performed prior to central venous catheter insertion Location details: right internal jugular Patient position: Trendelenburg Catheter type: triple lumen Pre-procedure: landmarks identified Ultrasound guidance: yes Sterile  ultrasound techniques: sterile gel and sterile probe covers were used Number of attempts:  1 Successful placement: yes Post-procedure: line sutured and dressing applied Assessment: blood return through all ports,  free fluid flow,  placement verified by x-ray and no pneumothorax on x-ray Patient tolerance: Patient tolerated the procedure well with no immediate complications   (including critical care time) Labs Review Labs Reviewed  COMPREHENSIVE METABOLIC PANEL - Abnormal; Notable for the following:    Glucose, Bld 144 (*)    Calcium 7.7 (*)    Albumin 2.7 (*)    AST 103 (*)    Total Bilirubin 1.6 (*)    All other components within normal limits  CBC WITH DIFFERENTIAL/PLATELET - Abnormal; Notable for the following:    WBC 12.6 (*)    RBC 3.64 (*)    Hemoglobin 8.7 (*)    HCT 27.0 (*)    MCV 74.2 (*)    MCH 23.9 (*)    RDW 17.5 (*)    Platelets 46 (*)    Neutro Abs 9.1 (*)    Monocytes Absolute 1.3 (*)    All other components within normal limits  PROTIME-INR - Abnormal; Notable for the following:    Prothrombin Time 20.0 (*)    INR 1.68 (*)    All other components within normal limits  I-STAT CHEM 8, ED - Abnormal; Notable for the following:    Glucose, Bld 141 (*)    Calcium, Ion 1.09 (*)    Hemoglobin 10.9 (*)    HCT 32.0 (*)    All other components within normal limits  MRSA PCR SCREENING  APTT  ETHANOL  TYPE AND SCREEN  PREPARE RBC (CROSSMATCH)  PREPARE FRESH FROZEN PLASMA  ABO/RH    Imaging Review Dg Chest Port 1 View  07/22/2014   CLINICAL DATA:  Hemoptysis, hypotension, vomited a large amount of dark blood at home, weakness, smoker, appears jaundiced  EXAM: PORTABLE CHEST - 1 VIEW  COMPARISON:  Portable exam 1903 hr without priors for comparison  FINDINGS: Borderline enlargement of cardiac silhouette for technique.  Mediastinal contours and pulmonary vascularity normal.  Subsegmental atelectasis RIGHT base.  Lungs otherwise clear.  No pleural effusion or pneumothorax.  IMPRESSION: RIGHT basilar subsegmental atelectasis.   Electronically Signed   By:  Ulyses SouthwardMark  Boles M.D.   On: 07/22/2014 19:53     EKG Interpretation None      CRITICAL CARE Performed by: Pricilla LovelessGOLDSTON, Waylan Busta T   Total critical care time: 75 minutes  Critical care time was exclusive of separately billable procedures and treating other patients.  Critical care was necessary to treat or prevent imminent or life-threatening deterioration.  Critical care was time spent personally by me on the following activities: development of treatment plan with patient and/or surrogate as well as nursing, discussions with consultants, evaluation of patient's response to treatment, examination of patient, obtaining history from patient or surrogate, ordering and performing treatments and interventions, ordering and review of laboratory studies, ordering and review of radiographic studies, pulse oximetry and re-evaluation of patient's condition.  MDM   Final diagnoses:  Upper GI bleed  Shock    Patient presents with acute hematemesis. Is hypotensive on arrival, blood pressure fluctuates with fluid resuscitation. Has evidence of acute bleeding with hemoglobin drop from 3 days ago. Is not confirmed but likely has cirrhosis given chronic alcohol use and mild jaundice. Patient's abdominal exam is benign. Started on emergency release blood given the amount of fluids he is requiring. I discussed his case  with the gastroenterologist on-call, Dr. Matthias Hughs, who evaluated in ED and will scope in [facility] ICU. Dr. Vaughan Basta of ICU excepts patient in transfer. Transfer was initially delayed due to other transfers, remained hypotensive. He is currently mentating well and has no peripheral signs of poor blood flow at this time. However given his systolic blood pressure still under 80 despite a second bag of blood, a central line was placed and he was started on low-dose pressors. I viewed his chest x-ray with the deep line placement without obvious pneumothorax. I did call ICU back and let them know this will need to be  retracted when he arrived to the ICU. Patient is critically ill but stable enough for transfer at this time.    Pricilla Loveless, MD 07/23/14 0010

## 2014-07-22 NOTE — ED Notes (Signed)
Pt actively vomiting blood, Dr Criss AlvineGoldston at bedside

## 2014-07-22 NOTE — Consult Note (Signed)
Referring Provider: Dr. Pricilla LovelessScott Goldston [facility]) Primary Care Physician:  No PCP Per Patient Primary Gastroenterologist:  None (unassigned)  Reason for Consultation:  hematemesis  HPI: Max Lin is a 34 y.o. male  who presented to the emergency room at [facility] today with massive hematemesis, probably 1 L at home (by his report) plus a liter of blood witnessed to have been vomited in the emergency room. His pressures have been in the 80s systolic despite liters of IV fluid supplementation and transfusion of packed cells.  The patient does not speak English but the history is obtained primarily through an emergency room nurse who does speak Spanish. The patient has not been exposed to ulcerogenic medications but does use alcohol and cocaine. There is no prior history of significant GI bleeding.   He runs a low platelet count (3 days ago it was 26,000, today it is 46,000) suggesting that he may have cirrhosis. However, we do not have any imaging studies of his liver so far.  He was at the emergency room 3 days ago and was seen by a psychiatrist for alcohol abuse. At that time, reported intake was a couple of beers daily plus a couple of cases on the weekends. Labs suggest alcohol pattern elevation of transaminases and mild hyperbilirubinemia.  History reviewed. No pertinent past medical history.  History reviewed. No pertinent past surgical history.  Prior to Admission medications   Not on File    Current Facility-Administered Medications  Medication Dose Route Frequency Provider Last Rate Last Dose  . 0.9 %  sodium chloride infusion  10 mL/hr Intravenous Once Pricilla LovelessScott Goldston, MD      . 0.9 %  sodium chloride infusion  10 mL/hr Intravenous Once Pricilla LovelessScott Goldston, MD      . cefTRIAXone (ROCEPHIN) 2 g in dextrose 5 % 50 mL IVPB  2 g Intravenous Once Pricilla LovelessScott Goldston, MD      . norepinephrine (LEVOPHED) 4 mg in dextrose 5 % 250 mL (0.016 mg/mL) infusion  2-50 mcg/min Intravenous  Titrated Pricilla LovelessScott Goldston, MD      . octreotide (SANDOSTATIN) 500 mcg in sodium chloride 0.9 % 250 mL (2 mcg/mL) infusion  50 mcg/hr Intravenous Continuous Pricilla LovelessScott Goldston, MD 25 mL/hr at 07/22/14 1941 50 mcg/hr at 07/22/14 1941  . sterile water (preservative free) injection            No current outpatient prescriptions on file.    Allergies as of 07/22/2014  . (No Known Allergies)    No family history on file.  History   Social History  . Marital Status: Single    Spouse Name: N/A  . Number of Children: N/A  . Years of Education: N/A   Occupational History  . Not on file.   Social History Main Topics  . Smoking status: Current Some Day Smoker  . Smokeless tobacco: Not on file  . Alcohol Use: Yes  . Drug Use: Yes    Special: Cocaine  . Sexual Activity: Not on file   Other Topics Concern  . Not on file   Social History Narrative    Review of Systems: The patient reports pain in his pelvis, almost in the inguinal region bilaterally, as well as some chest pain  Physical Exam: Vital signs in last 24 hours: Temp:  [98.1 F (36.7 C)-98.5 F (36.9 C)] 98.1 F (36.7 C) (05/07 2200) Pulse Rate:  [74-110] 100 (05/07 2220) Resp:  [14-23] 21 (05/07 2225) BP: (65-107)/(28-83) 80/44 mmHg (05/07 2225)  SpO2:  [94 %-100 %] 98 % (05/07 2225)   General:   Alert,  Well-developed, well-nourished, does not appear chronically ill, is somewhat jittery or shaky but this appears to be more because he is cold than withdrawal tremors. Head:  Normocephalic and atraumatic. Eyes:  Sclera clear, hint of icterus.   Conjunctiva pink. Mouth:   No ulcerations or lesions.  Oropharynx pink & moist. Neck:   No masses or thyromegaly. Lungs:  Clear throughout to auscultation.   No wheezes, crackles, or rhonchi. No evident respiratory distress. Heart:   Regular rate and rhythm; no murmurs, clicks, rubs,  or gallops. Abdomen:  Soft, nontender, nontympanitic, and nondistended. No masses,  hepatosplenomegaly or ventral hernias noted. Normal bowel sounds, without bruits, guarding, or rebound.  I'm not able to feel the liver or the spleen. There is equivocal flank tympany, so I'm not convinced whether or not ascites is present. He does not have massive ascites, in any event. Msk:   Symmetrical without gross deformities. Pulses:  Radial pulse is a little bit thready Extremities:   Without clubbing, cyanosis, or edema. Neurologic:  Alert and coherent;  grossly normal neurologically. Skin:  Intact without significant lesions or rashes. No palmar erythema or spider angiomata. Cervical Nodes:  No significant cervical adenopathy. Psych:   Alert and cooperative. Normal mood and affect.  Intake/Output from previous day:   Intake/Output this shift: Total I/O In: 120 [Blood:120] Out: 1100 [Emesis/NG output:1100]  Lab Results:  Recent Labs  07/22/14 1921 07/22/14 1929  WBC 12.6*  --   HGB 8.7* 10.9*  HCT 27.0* 32.0*  PLT 46*  --    BMET  Recent Labs  07/22/14 1921 07/22/14 1929  NA 136 137  K 3.7 3.7  CL 106 102  CO2 23  --   GLUCOSE 144* 141*  BUN 17 17  CREATININE 0.89 1.00  CALCIUM 7.7*  --    LFT  Recent Labs  07/22/14 1921  PROT 6.7  ALBUMIN 2.7*  AST 103*  ALT 41  ALKPHOS 109  BILITOT 1.6*   PT/INR  Recent Labs  07/22/14 1921  LABPROT 20.0*  INR 1.68*    Studies/Results: Dg Chest Port 1 View  07/22/2014   CLINICAL DATA:  Hemoptysis, hypotension, vomited a large amount of dark blood at home, weakness, smoker, appears jaundiced  EXAM: PORTABLE CHEST - 1 VIEW  COMPARISON:  Portable exam 1903 hr without priors for comparison  FINDINGS: Borderline enlargement of cardiac silhouette for technique.  Mediastinal contours and pulmonary vascularity normal.  Subsegmental atelectasis RIGHT base.  Lungs otherwise clear.  No pleural effusion or pneumothorax.  IMPRESSION: RIGHT basilar subsegmental atelectasis.   Electronically Signed   By: Ulyses Southward M.D.    On: 07/22/2014 19:53    Impression: 1. Massive hematemesis with hemodynamic instability characterized by systolic pressure in the 80s despite attempted fluid resuscitation. 2. Posthemorrhagic anemia, acute 3. History of ethanol excess and cocaine exposure 4. Severe thrombocytopenia, suggestive of cirrhosis  Plan: The patient is already on PPI therapy, octreotide, and prophylactic antibiotics. I feel he should have elective endotracheal intubation to help protect the airway, after which we should do urgent endoscopy tonight to try to define the source of bleeding and, to the degree possible, prevent it from happening again. I have discussed the nature, purpose, and risks of the procedure with the patient with help of an interpreter, and he is agreeable to proceed.   LOS: 0 days   Kable Haywood V  07/22/2014, 10:38 PM  Pager [phone] If no answer or after 5 PM call [phone]

## 2014-07-22 NOTE — ED Notes (Signed)
Per EMS pt reports waking up this afternoon and throwing up blood, per EMS large amount of dark blood on scene, pt is also hypotensive and weak, he reports drinking one beer today and more so yesterday; pt appears jaundiced, denies any known  liver issues, reports epigastric pain.

## 2014-07-23 ENCOUNTER — Inpatient Hospital Stay (HOSPITAL_COMMUNITY): Payer: MEDICAID

## 2014-07-23 ENCOUNTER — Encounter (HOSPITAL_COMMUNITY): Payer: Self-pay | Admitting: Gastroenterology

## 2014-07-23 DIAGNOSIS — K922 Gastrointestinal hemorrhage, unspecified: Secondary | ICD-10-CM

## 2014-07-23 DIAGNOSIS — J9601 Acute respiratory failure with hypoxia: Secondary | ICD-10-CM

## 2014-07-23 LAB — BLOOD GAS, ARTERIAL
Acid-base deficit: 7.7 mmol/L — ABNORMAL HIGH (ref 0.0–2.0)
BICARBONATE: 18.2 meq/L — AB (ref 20.0–24.0)
Drawn by: 422461
FIO2: 1 %
MECHVT: 500 mL
O2 SAT: 100 %
PEEP: 5 cmH2O
PO2 ART: 334 mmHg — AB (ref 80.0–100.0)
Patient temperature: 98.1
RATE: 14 resp/min
TCO2: 19.5 mmol/L (ref 0–100)
pCO2 arterial: 42.3 mmHg (ref 35.0–45.0)
pH, Arterial: 7.255 — ABNORMAL LOW (ref 7.350–7.450)

## 2014-07-23 LAB — COMPREHENSIVE METABOLIC PANEL
ALT: 32 U/L (ref 17–63)
AST: 76 U/L — ABNORMAL HIGH (ref 15–41)
Albumin: 2.3 g/dL — ABNORMAL LOW (ref 3.5–5.0)
Alkaline Phosphatase: 80 U/L (ref 38–126)
Anion gap: 5 (ref 5–15)
BUN: 15 mg/dL (ref 6–20)
CALCIUM: 6.6 mg/dL — AB (ref 8.9–10.3)
CO2: 20 mmol/L — AB (ref 22–32)
CREATININE: 0.78 mg/dL (ref 0.61–1.24)
Chloride: 111 mmol/L (ref 101–111)
GFR calc Af Amer: >60 mL/min (ref >60)
GFR calc non Af Amer: >60 mL/min (ref >60)
GLUCOSE: 161 mg/dL — AB (ref 70–99)
Potassium: 5 mmol/L (ref 3.5–5.1)
Sodium: 136 mmol/L (ref 135–145)
Total Bilirubin: 2.1 mg/dL — ABNORMAL HIGH (ref 0.3–1.2)
Total Protein: 5.4 g/dL — ABNORMAL LOW (ref 6.5–8.1)

## 2014-07-23 LAB — CBC WITH DIFFERENTIAL/PLATELET
BASOS PCT: 0 % (ref 0–1)
Basophils Absolute: 0.1 10*3/uL (ref 0.0–0.1)
EOS ABS: 0 10*3/uL (ref 0.0–0.7)
EOS PCT: 0 % (ref 0–5)
HEMATOCRIT: 24.5 % — AB (ref 39.0–52.0)
Hemoglobin: 8 g/dL — ABNORMAL LOW (ref 13.0–17.0)
LYMPHS ABS: 2 10*3/uL (ref 0.7–4.0)
Lymphocytes Relative: 12 % (ref 12–46)
MCH: 24.7 pg — ABNORMAL LOW (ref 26.0–34.0)
MCHC: 32.7 g/dL (ref 30.0–36.0)
MCV: 75.6 fL — ABNORMAL LOW (ref 78.0–100.0)
MONO ABS: 1.6 10*3/uL — AB (ref 0.1–1.0)
MONOS PCT: 10 % (ref 3–12)
NEUTROS PCT: 78 % — AB (ref 43–77)
Neutro Abs: 13 10*3/uL — ABNORMAL HIGH (ref 1.7–7.7)
PLATELETS: 72 10*3/uL — AB (ref 150–400)
RBC: 3.24 MIL/uL — ABNORMAL LOW (ref 4.22–5.81)
RDW: 17.1 % — AB (ref 11.5–15.5)
WBC: 16.7 10*3/uL — AB (ref 4.0–10.5)

## 2014-07-23 LAB — PROTIME-INR
INR: 1.6 — AB (ref 0.00–1.49)
Prothrombin Time: 19.2 seconds — ABNORMAL HIGH (ref 11.6–15.2)

## 2014-07-23 LAB — CBC
HCT: 21.4 % — ABNORMAL LOW (ref 39.0–52.0)
HEMOGLOBIN: 7.1 g/dL — AB (ref 13.0–17.0)
MCH: 24.8 pg — AB (ref 26.0–34.0)
MCHC: 33.2 g/dL (ref 30.0–36.0)
MCV: 74.8 fL — ABNORMAL LOW (ref 78.0–100.0)
Platelets: 76 10*3/uL — ABNORMAL LOW (ref 150–400)
RBC: 2.86 MIL/uL — AB (ref 4.22–5.81)
RDW: 17.5 % — ABNORMAL HIGH (ref 11.5–15.5)
WBC: 21 10*3/uL — AB (ref 4.0–10.5)

## 2014-07-23 LAB — ABO/RH: ABO/RH(D): O POS

## 2014-07-23 LAB — PHOSPHORUS: Phosphorus: 4.4 mg/dL (ref 2.5–4.6)

## 2014-07-23 LAB — MRSA PCR SCREENING: MRSA by PCR: NEGATIVE

## 2014-07-23 LAB — MAGNESIUM: MAGNESIUM: 1.4 mg/dL — AB (ref 1.7–2.4)

## 2014-07-23 LAB — TRIGLYCERIDES: Triglycerides: 100 mg/dL (ref <150)

## 2014-07-23 MED ORDER — SODIUM CHLORIDE 0.9 % IV SOLN
25.0000 ug/h | INTRAVENOUS | Status: DC
  Filled 2014-07-23: qty 50

## 2014-07-23 MED ORDER — MIDAZOLAM HCL 2 MG/2ML IJ SOLN: 2.0000 mg | INTRAMUSCULAR | Status: DC | PRN

## 2014-07-23 MED ORDER — FENTANYL CITRATE (PF) 100 MCG/2ML IJ SOLN: 150.0000 ug | Freq: Once | INTRAMUSCULAR | Status: DC

## 2014-07-23 MED ORDER — LACTATED RINGERS IV SOLN
INTRAVENOUS | Status: DC
  Administered 2014-07-23 – 2014-07-24 (×4): via INTRAVENOUS

## 2014-07-23 MED ORDER — FENTANYL CITRATE (PF) 100 MCG/2ML IJ SOLN
50.0000 ug | Freq: Once | INTRAMUSCULAR | Status: AC
  Administered 2014-07-23: 50 ug via INTRAVENOUS

## 2014-07-23 MED ORDER — PROPOFOL 1000 MG/100ML IV EMUL
INTRAVENOUS | Status: AC
  Filled 2014-07-23: qty 100

## 2014-07-23 MED ORDER — PREDNISOLONE 15 MG/5ML PO SOLN
40.0000 mg | Freq: Every day | ORAL | Status: DC
  Administered 2014-07-24 – 2014-07-27 (×4): 40 mg via ORAL
  Filled 2014-07-23 (×9): qty 15

## 2014-07-23 MED ORDER — FENTANYL BOLUS VIA INFUSION
50.0000 ug | INTRAVENOUS | Status: DC | PRN
  Filled 2014-07-23: qty 50

## 2014-07-23 MED ORDER — LORAZEPAM 2 MG/ML IJ SOLN: 1.0000 mg | INTRAMUSCULAR | Status: DC | PRN

## 2014-07-23 MED ORDER — CHLORHEXIDINE GLUCONATE 0.12 % MT SOLN: 15.0000 mL | Freq: Two times a day (BID) | OROMUCOSAL | Status: DC

## 2014-07-23 MED ORDER — SODIUM CHLORIDE 0.9 % IV SOLN
250.0000 mL | INTRAVENOUS | Status: DC | PRN
  Administered 2014-07-23: 250 mL via INTRAVENOUS

## 2014-07-23 MED ORDER — ETOMIDATE 2 MG/ML IV SOLN
10.0000 mg | Freq: Once | INTRAVENOUS | Status: AC
  Administered 2014-07-23: 10 mg via INTRAVENOUS

## 2014-07-23 MED ORDER — PROPOFOL 1000 MG/100ML IV EMUL
0.0000 ug/kg/min | INTRAVENOUS | Status: DC
  Administered 2014-07-23 (×2): 50 ug/kg/min via INTRAVENOUS

## 2014-07-23 MED ORDER — MIDAZOLAM HCL 2 MG/2ML IJ SOLN: 2.0000 mg | Freq: Once | INTRAMUSCULAR | Status: AC

## 2014-07-23 MED ORDER — FENTANYL CITRATE (PF) 100 MCG/2ML IJ SOLN: 50.0000 ug | INTRAMUSCULAR | Status: DC | PRN

## 2014-07-23 MED ORDER — FENTANYL CITRATE (PF) 100 MCG/2ML IJ SOLN: 50.0000 ug | Freq: Once | INTRAMUSCULAR | Status: DC

## 2014-07-23 MED ORDER — MIDAZOLAM HCL 2 MG/2ML IJ SOLN
2.0000 mg | Freq: Once | INTRAMUSCULAR | Status: AC
  Administered 2014-07-23: 2 mg via INTRAVENOUS

## 2014-07-23 MED ORDER — CETYLPYRIDINIUM CHLORIDE 0.05 % MT LIQD
7.0000 mL | Freq: Four times a day (QID) | OROMUCOSAL | Status: DC
  Administered 2014-07-23: 7 mL via OROMUCOSAL

## 2014-07-23 NOTE — Progress Notes (Signed)
ETT pulled back to 21 at lips per MD order

## 2014-07-23 NOTE — Progress Notes (Signed)
GASTROENTEROLOGY PROGRESS NOTE  Problem:   Initial variceal bleed. Liver disease of uncertain chronicity, due to alcohol. Posthemorrhagic anemia. Language barrier.  Subjective: The patient has some abdominal discomfort and a headache. He had a large melenic stool earlier today, according to the nurse.  Objective: The patient was successfully extubated this morning and has maintained satisfactory blood pressure, off Levofed. Still on octreotide. He remains tachycardic, which could be multifactorial including anemia, stress, volume contraction, and early alcohol withdrawal. He is alert and fully cognizant and is asking appropriate questions. Mild drop in hemoglobin today, which I think reflex equilibration. BUN is normal. He has only received 2 units of packed cells, so I predict his hemoglobin will probably level off somewhere close to where it is now, unless further bleeding occurs.  Assessment: Quiescent variceal bleed. Acute posthemorrhagic anemia. Severe liver disease, acute versus chronic.  Plan: 1. Abdominal ultrasound the morning to look for changes of cirrhosis, and to check for ascites. 2. Through a lengthy conversation with a relative of the patient on the telephone acting as an interpreter, as well as with limited direct communication with the patient and his friend today at the bedside, I have tried to convey the information that his bleed was due to a bad liver, and the bed liver is due to alcohol, which he needs to stop or he will die. 3. I would strongly advocate patient education with the help of a professional medical interpreter. I sense that this patient may be willing to avoid alcohol in the future if he realizes the gravity of the situation, and the relationship of alcohol to his current illness and his future prognosis.  Time spent discussing his case with the nurse, the patient and his friend, and the relative on the telephone, totalled approximately 30 minutes  today.  Florencia Reasonsobert V. Antoino Westhoff, M.D. 07/23/2014 4:05 PM  Pager [phone] If no answer or after 5 PM call [phone]

## 2014-07-23 NOTE — Progress Notes (Signed)
PULMONARY / CRITICAL CARE MEDICINE   Name: Max FettersJuan Torres Lin MRN: [id number] DOB: 1980/06/23    ADMISSION DATE:  07/22/2014 CONSULTATION DATE:  07/22/14  REFERRING MD :  [facility]  CHIEF COMPLAINT:  GI bleed  INITIAL PRESENTATION: hematemesis  STUDIES: none  SIGNIFICANT EVENTS: s/p ETT placement for airway protection in light of endoscopic procedure  HISTORY OF PRESENT ILLNESS:  33yo [name] who presented to the [facility] with complaints of hematemesis. Per report he had about 1L at home and then more in the ED. He became hypotensive at OSH and received IVF. I was able to briefly speak with him and he mentioned that he drank a substantial amount of beer, mostly Budweiser. He did not quantify how much. Per report he was also recently in the ED for alcohol detox.    SUBJECTIVE:   VITAL SIGNS: Temp:  [98.1 F (36.7 C)-98.6 F (37 C)] 98.4 F (36.9 C) (05/08 0400) Pulse Rate:  [74-163] 118 (05/08 0700) Resp:  [9-25] 12 (05/08 0700) BP: (65-189)/(28-87) 96/47 mmHg (05/08 0700) SpO2:  [94 %-100 %] 100 % (05/08 0700) FiO2 (%):  [40 %-100 %] 40 % (05/08 0803) Weight:  [188 lb 0.8 oz (85.3 kg)-188 lb 4.4 oz (85.4 kg)] 188 lb 4.4 oz (85.4 kg) (05/08 0500) HEMODYNAMICS:   VENTILATOR SETTINGS: Vent Mode:  [-] CPAP;PSV FiO2 (%):  [40 %-100 %] 40 % Set Rate:  [14 bmp-20 bmp] 20 bmp Vt Set:  [500 mL] 500 mL PEEP:  [5 cmH20] 5 cmH20 Pressure Support:  [5 cmH20] 5 cmH20 Plateau Pressure:  [14 cmH20-20 cmH20] 15 cmH20 INTAKE / OUTPUT:  Intake/Output Summary (Last 24 hours) at 07/23/14 0810 Last data filed at 07/23/14 0700  Gross per 24 hour  Intake 1684.92 ml  Output   1775 ml  Net -90.08 ml    PHYSICAL EXAMINATION: General:  Pt appears well nourished, well groomed, and his stated age.  Neuro:  Sedated but arousal able   HEENT:  [place]/AT, PERRL, EOMI, nose permeable, midline septum, oropharynx is clear, no blood noted. Cardiovascular:  s1s2 tachycardic, no M/r/g, no  edema Lungs:  CTA bilaterally, no wheezes rhonchi Abdomen:  Soft, nontender nondistended +bowel sounds Musculoskeletal:  ROM intact, no atrophy noted Skin:  No lesions nor ulcers noted  LABS:  CBC  Recent Labs Lab 07/19/14 0849 07/22/14 1921 07/22/14 1929 07/23/14 0437  WBC 5.7 12.6*  --  16.7*  HGB 11.4* 8.7* 10.9* 8.0*  HCT 35.3* 27.0* 32.0* 24.5*  PLT 26* 46*  --  72*   Coag's  Recent Labs Lab 07/19/14 0849 07/22/14 1921 07/23/14 0437  APTT 38* 32  --   INR 1.41 1.68* 1.60*   BMET  Recent Labs Lab 07/19/14 0849 07/22/14 1921 07/22/14 1929 07/23/14 0321  NA 138 136 137 136  K 3.5 3.7 3.7 5.0  CL 103 106 102 111  CO2 25 23  --  20*  BUN 6 17 17 15   CREATININE 0.59* 0.89 1.00 0.78  GLUCOSE 105* 144* 141* 161*   Electrolytes  Recent Labs Lab 07/19/14 0849 07/22/14 1921 07/23/14 0321  CALCIUM 8.1* 7.7* 6.6*  MG  --   --  1.4*  PHOS  --   --  4.4   Sepsis Markers No results for input(s): LATICACIDVEN, PROCALCITON, O2SATVEN in the last 168 hours. ABG  Recent Labs Lab 07/23/14 0150  PHART 7.255*  PCO2ART 42.3  PO2ART 334*   Liver Enzymes  Recent Labs Lab 07/19/14 0849 07/22/14  1921 07/23/14 0321  AST 168* 103* 76*  ALT 49 41 32  ALKPHOS 117 109 80  BILITOT 1.4* 1.6* 2.1*  ALBUMIN 3.6 2.7* 2.3*   Cardiac Enzymes No results for input(s): TROPONINI, PROBNP in the last 168 hours. Glucose No results for input(s): GLUCAP in the last 168 hours.  Imaging Dg Chest Portable 1 View  07/22/2014   CLINICAL DATA:  Central line placement, hematemesis, hypotension  EXAM: PORTABLE CHEST - 1 VIEW  COMPARISON:  Portable exam 2211 hr compared to 1903 hr  FINDINGS: RIGHT jugular central venous catheter with tip projecting low over RIGHT atrium, recommend withdrawal 8 cm.  Enlargement of cardiac silhouette.  Mediastinal contours and pulmonary vascularity normal.  Lungs clear.  No pleural effusion or pneumothorax.  IMPRESSION: RIGHT jugular central venous  catheter projects low over RIGHT atrium, recommend withdrawal 8 cm.  Findings called to [facility] RN on 2 Heart (accepting patient on transfer) on 07/22/2014 at 2049 hr.   Electronically Signed   By: Ulyses Southward M.D.   On: 07/22/2014 22:49   Dg Chest Port 1 View  07/22/2014   CLINICAL DATA:  Hemoptysis, hypotension, vomited a large amount of dark blood at home, weakness, smoker, appears jaundiced  EXAM: PORTABLE CHEST - 1 VIEW  COMPARISON:  Portable exam 1903 hr without priors for comparison  FINDINGS: Borderline enlargement of cardiac silhouette for technique.  Mediastinal contours and pulmonary vascularity normal.  Subsegmental atelectasis RIGHT base.  Lungs otherwise clear.  No pleural effusion or pneumothorax.  IMPRESSION: RIGHT basilar subsegmental atelectasis.   Electronically Signed   By: Ulyses Southward M.D.   On: 07/22/2014 19:53     ASSESSMENT / PLAN:  PULMONARY OETT 7.5cm 21 to teeth A: acute respiratory failure 2/2 need for airway protection P:  Plan to extubate when RT's come in  CARDIOVASCULAR CVL RIJ placed at [facility]. IJ was pulled back several CM as it was lying the in right atrium A: Hypovolemic shock 2/2 GI bleed P: pressors as needed(currently off)      Transfusions as needed  RENAL A:  No acute issues P:    GASTROINTESTINAL A:  Hemorrhagic Shock 2/2 GI bleed, Varices post banding 5/7 Alcoholic hepatitis with a discriminant function of 38.3.   P:  PPI , octreotide , prednisone Pt may benefit from prednisolone.  Will start  po qd.    HEMATOLOGIC  Recent Labs  07/22/14 1929 07/23/14 0437  HGB 10.9* 8.0*    A:  Acute blood loss anemia 2/2 GI bleed, thrombocytopenia likely 2/2 EtOH abuse.  P: transfuse for Hb goal > 7. Serial Hb checks. Will hold on transfusing platelets at this time.   INFECTIOUS A:  No active issues P:  S/p ceftriaxone x 1 for GI bleed in potential cirrhotic patient  ENDOCRINE A:  No active issues P:    NEUROLOGIC A:  EtOH abuse, active P:   Monitor for signs of withdrawal. Pt will be sedated with fentanyl and propofol. He will likely benefit from long acting benzodiazepines.  RASS goal: 0    FAMILY  - Updates: No family at bedside  - Inter-disciplinary family meet or Palliative Care meeting due by:  day 7    TODAY'S SUMMARY: pt is now intubated and sedated. The EGD was performed per Dr. Matthias Hughs with banding of varices x 6. Off pressors. Plan to stop sedation and extubate 5/8.    Brett Canales Minor ACNP [facility] P[facility] Pager [phone] till 3 pm If no answer page [phone] 07/23/2014,  8:10 AM  Reviewed above, examined.  Successfully extubated.  Mental status okay.  No wheeze.  Abd soft.  Monitor respiratory status.  Monitor hemodynamics.  F/u Hb.  Monitor for DT's.  Continue prednisone, octreotide per GI.  CC time by me independent of APP time is 35 minutes.   Coralyn HellingVineet Robbyn Hodkinson, MD [facility] Pulmonary/Critical Care 07/23/2014, 2:01 PM Pager:  [phone] After 3pm call: [phone]

## 2014-07-23 NOTE — H&P (Signed)
PULMONARY / CRITICAL CARE MEDICINE   Name: Max Lin MRN: [id number] DOB: 12-29-1980    ADMISSION DATE:  07/22/2014 CONSULTATION DATE:  07/22/14  [facility] :  [facility]  CHIEF COMPLAINT:  GI bleed  INITIAL PRESENTATION: hematemesis  STUDIES: none  SIGNIFICANT EVENTS: s/p ETT placement for airway protection in light of endoscopic procedure  HISTORY OF PRESENT ILLNESS:  33yo Max Lin male who presented to the [facility] ED with complaints of hematemesis. Per report he had about 1L at home and then more in the ED. He became hypotensive at OSH and received IVF. I was able to briefly speak with him and he mentioned that he drank a substantial amount of beer, mostly Budweiser. He did not quantify how much. Per report he was also recently in the ED for alcohol detox.   PAST MEDICAL HISTORY :   has no past medical history on file.  has no past surgical history on file. Prior to Admission medications   Not on File   No Known Allergies  FAMILY HISTORY:  has no family status information on file.  SOCIAL HISTORY:  reports that he has been smoking.  He does not have any smokeless tobacco history on file. He reports that he drinks alcohol. He reports that he uses illicit drugs (Cocaine).  REVIEW OF SYSTEMS:  Systems were reviewed. Pertinent positives include headache, nausea, vomiting, hematemesis, and abdominal pain. All others negative.   SUBJECTIVE:   VITAL SIGNS: Temp:  [98.1 F (36.7 C)-98.5 F (36.9 C)] 98.1 F (36.7 C) (05/07 2200) Pulse Rate:  [74-110] 100 (05/07 2220) Resp:  [14-23] 21 (05/07 2225) BP: (65-107)/(28-83) 80/44 mmHg (05/07 2225) SpO2:  [94 %-100 %] 98 % (05/07 2225) FiO2 (%):  [100 %] 100 % (05/08 0015) Weight:  [188 lb 0.8 oz (85.3 kg)] 188 lb 0.8 oz (85.3 kg) (05/08 0015) HEMODYNAMICS:   VENTILATOR SETTINGS: Vent Mode:  [-] PRVC FiO2 (%):  [100 %] 100 % Set Rate:  [14 bmp] 14 bmp Vt Set:  [500 mL] 500 mL PEEP:  [5 cmH20] 5  cmH20 Plateau Pressure:  [20 cmH20] 20 cmH20 INTAKE / OUTPUT:  Intake/Output Summary (Last 24 hours) at 07/23/14 0125 Last data filed at 07/22/14 2344  Gross per 24 hour  Intake    620 ml  Output   1100 ml  Net   -480 ml    PHYSICAL EXAMINATION: General:  Pt appears well nourished, well groomed, and his stated age.  Neuro:  AAOx4, no focal deficits.  HEENT:  [place]/AT, PERRL, EOMI, nose permeable, midline septum, oropharynx is clear, no blood noted. Cardiovascular:  s1s2 tachycardic, no M/r/g, no edema Lungs:  CTA bilaterally, no wheezes rhonchi Abdomen:  Soft, nontender nondistended +bowel sounds Musculoskeletal:  ROM intact, no atrophy noted Skin:  No lesions nor ulcers noted  LABS:  CBC  Recent Labs Lab 07/19/14 0849 07/22/14 1921 07/22/14 1929  WBC 5.7 12.6*  --   HGB 11.4* 8.7* 10.9*  HCT 35.3* 27.0* 32.0*  PLT 26* 46*  --    Coag's  Recent Labs Lab 07/19/14 0849 07/22/14 1921  APTT 38* 32  INR 1.41 1.68*   BMET  Recent Labs Lab 07/19/14 0849 07/22/14 1921 07/22/14 1929  NA 138 136 137  K 3.5 3.7 3.7  CL 103 106 102  CO2 25 23  --   BUN CREATININE 0.59* 0.89 1.00  GLUCOSE 105* 144* 141*   Electrolytes  Recent Labs Lab 07/19/14 0849  07/22/14 1921  CALCIUM 8.1* 7.7*   Sepsis Markers No results for input(s): LATICACIDVEN, PROCALCITON, O2SATVEN in the last 168 hours. ABG No results for input(s): PHART, PCO2ART, PO2ART in the last 168 hours. Liver Enzymes  Recent Labs Lab 07/19/14 0849 07/22/14 1921  AST 168* 103*  ALT 49 41  ALKPHOS 117 109  BILITOT 1.4* 1.6*  ALBUMIN 3.6 2.7*   Cardiac Enzymes No results for input(s): TROPONINI, PROBNP in the last 168 hours. Glucose No results for input(s): GLUCAP in the last 168 hours.  Imaging Dg Chest Portable 1 View  07/22/2014   CLINICAL DATA:  Central line placement, hematemesis, hypotension  EXAM: PORTABLE CHEST - 1 VIEW  COMPARISON:  Portable exam 2211 hr compared to 1903 hr   FINDINGS: RIGHT jugular central venous catheter with tip projecting low over RIGHT atrium, recommend withdrawal 8 cm.  Enlargement of cardiac silhouette.  Mediastinal contours and pulmonary vascularity normal.  Lungs clear.  No pleural effusion or pneumothorax.  IMPRESSION: RIGHT jugular central venous catheter projects low over RIGHT atrium, recommend withdrawal 8 cm.  [facility]accepting patient on transfer) on 07/22/2014 at 2049 hr.   Electronically Signed   By: Ulyses SouthwardMark  Boles M.D.   On: 07/22/2014 22:49   Dg Chest Port 1 View  07/22/2014   CLINICAL DATA:  Hemoptysis, hypotension, vomited a large amount of dark blood at home, weakness, smoker, appears jaundiced  EXAM: PORTABLE CHEST - 1 VIEW  COMPARISON:  Portable exam 1903 hr without priors for comparison  FINDINGS: Borderline enlargement of cardiac silhouette for technique.  Mediastinal contours and pulmonary vascularity normal.  Subsegmental atelectasis RIGHT base.  Lungs otherwise clear.  No pleural effusion or pneumothorax.  IMPRESSION: RIGHT basilar subsegmental atelectasis.   Electronically Signed   By: Ulyses SouthwardMark  Boles M.D.   On: 07/22/2014 19:53     ASSESSMENT / PLAN:  PULMONARY OETT 7.5cm 21 to teeth A: acute respiratory failure 2/2 need for airway protection P:  Plan to extubate in the AM if remains HD stable  [facility] IJ was pulled back several CM as it was lying the in right atrium A: Hypovolemic shock 2/2 GI bleed P: continue vasopressors PRN, provided a 500cc bolus during intubation  RENAL A:  No acute issues P:    GASTROINTESTINAL A:  Hemorrhagic Shock 2/2 GI bleed, likely upper. Esophageal vs. PUD. Alcoholic hepatitis with a discriminant function of 38.3.   P:  Case discussed with GI, appreciate their recs. EGD now. I obtained verbal consent from the patient for the EGD with the translator on speakerphone as a witness since the patient does not speak english. Will trend  Hb. Further management based on GI recs. IV PPI and octreotide infusing.  Pt may benefit from prednisolone.  Will start 40mg  po qd.    HEMATOLOGIC A:  Acute blood loss anemia 2/2 GI bleed, thrombocytopenia likely 2/2 EtOH abuse.  P: transfuse for Hb goal > 7. Serial Hb checks. Will hold on transfusing platelets at this time.   INFECTIOUS A:  No active issues P:  S/p ceftriaxone for GI bleed in potential cirrhotic patient  ENDOCRINE A:  No active issues P:    NEUROLOGIC A:  EtOH abuse, active P:  Monitor for signs of withdrawal. Pt will be sedated with fentanyl and propofol. He will likely benefit from long acting benzodiazepines.  RASS goal: 0    FAMILY  - Updates: discussed care with the patients friend.  Family is en route  - Inter-disciplinary family meet or Palliative Care meeting due by:  day 7    TODAY'S SUMMARY: pt is now intubated and sedated. The EGD was about to be performed when I left the room. Levophed was being weaned off. Central line was also pulled back several cm.     Critical care time 50 minutes  Pulmonary and Critical Care Medicine [facility] Pager: [phone]  07/23/2014, 1:25 AM

## 2014-07-23 NOTE — Progress Notes (Signed)
Pt had large black stool (melena).    Max SoursAngela Laurieann Friddle

## 2014-07-23 NOTE — Progress Notes (Signed)
Patient's endoscopy, done with the patient intubated and sedated, went smoothly. He had a moderate amount of blood and clot in the stomach, probably on the order of 100-200 ML's. No active bleeding was identified at the time of this exam, but he did have a large distal esophageal varices. Therefore, banding was performed 7.  The patient tolerated the procedure well.  In my opinion, at least from the GI tract standpoint, I think it would be best if the patient did not have an NG tube although, if it is felt to be important for prevention of regurgitation or for administration of medication, I think there is a relatively low likelihood that it would disrupt his varices.  Since the patient now has clearly documented portal hypertension, at some point in the next couple of days, it would be helpful to image his liver to try to determine whether this is cirrhosis or potentially reversible alcoholic hepatitis.  Florencia Reasonsobert V. Marcellina Jonsson, M.D. Pager [phone] If no answer or after 5 PM call [phone]

## 2014-07-23 NOTE — Op Note (Signed)
[facility] [address] [place], [id number]   ENDOSCOPY PROCEDURE REPORT  PATIENT: Max Lin, Max Lin  MR#: #[id number] BIRTHDATE: November 25, 1980 , 33  yrs. old GENDER: male ENDOSCOPIST:Daielle Melcher, MD REFERRED BY: Unassigned PROCEDURE DATE:  07/23/2014 PROCEDURE:   Upper endoscopy with variceal banding ASA CLASS:    IV INDICATIONS: hematemesis, h/o ethanol excess MEDICATION: (pt intubated and sedated per PCCM) TOPICAL ANESTHETIC:   none  DESCRIPTION OF PROCEDURE:   After the risks and benefits of the procedure were explained, informed consent was obtained.I had explained the nature, purpose, and risks of the procedure to the patient at [facility], [facility], with the help of the emergency room nurse who was conversant in Spanish. In addition, consent was obtained by the pulmonary critical care medicine fellow with the help of a telephone interpreter.  The procedure was done at the bedside in the [facility] ICU.  The Pentax Gastroscope [id number]  endoscope was introduced through the mouth  and advanced to the second portion of the duodenum . The instrument was slowly withdrawn as the mucosa was fully examined. Estimated blood loss is zero unless otherwise noted in this procedure report.    the scope was passed under direct vision, entering the esophagus without undue difficulty.  the proximal esophagus was normal, but the distal half of the esophagus had large varices which, distally, had red color signs but no protuberant clot, and no active bleeding.  The stomach was entered. It contained a moderate amount of clotted blood and liquid blood, probably totaling about 100-200 ML's. The proximal stomach had some changes suggestive of portal gastropathy, with a "snakeskin" appearance to the mucosa. No gastritis, erosions, polyps, masses, ulcers, or vascular ectasia were noted in the stomach, although  there was some old blood coating much of the mucosa that might have obscured small or focal abnormalities.  The pylorus, duodenal bulb, and second duodenum looked normal.  I then proceeded to banding the varices. 6 bands were successfully applied, covering all 3 variceal columns. Fortunately, this did not cause any bleeding.  The scope was then removed from the patient, who appeared to tolerate the procedure very well.  retroflexion in the cardia showed a moderate bloody residual but no obvious gastric varices were seen.  The scope was then withdrawn from the patient and the procedure completed.  COMPLICATIONS: There were no immediate complications.  ENDOSCOPIC IMPRESSION: 1. No active bleeding seen at the time of this procedure 2. All blood, consistent with recent bleeding, present in the proximal stomach 3. Large esophageal varices without discrete stigmata of hemorrhage, but which are the presumed source of bleeding in this patient, therefore they were banded 4. Mild portal gastropathy   RECOMMENDATIONS: 1. Continue octreotide, ideally for several more days. 2. Abdominal ultrasound at the bedside sometime in the next few days, to further characterize how much scarring there is in the liver 3. Otherwise, standard variceal bleeding management   _______________________________ eSignedBernette Redbird:  Joycelyn Liska, MD 07/23/2014 1:39 AM     cc:  CPT CODES: ICD CODES:  The ICD and CPT codes recommended by this software are interpretations from the data that the clinical staff has captured with the software.  The verification of the translation of this report to the ICD and CPT codes and modifiers is the sole responsibility of the health care institution and practicing physician where this report was generated.  [employer] will not be  held responsible for the validity of the ICD and CPT codes included on this report.  AMA assumes no liability for data  contained or not contained herein. CPT is a Publishing rights managerregistered trademark of the [employer].  PATIENT NAME:  Max Lin, Max Lin MR#: #[id number]

## 2014-07-23 NOTE — Procedures (Signed)
Endotracheal Intubation Procedure Note  Indication for endotracheal intubation: airway compromise and potential airway compromise. GI bleed Airway Assessment: Mallampati Class: II (hard and soft palate, upper portion of tonsils anduvula visible). Sedation: etomidate, fentanyl and midazolam. Paralytic: rocuronium. Lidocaine: no. Atropine: no. Equipment: Macintosh 3 laryngoscope blade. Cricoid Pressure: no. Number of attempts: 1. ETT location confirmed by by auscultation.  Pt was a grade 3 view with a MAC 3. Pt required substantial sedation with fentanyl and versed. He then required some etomidate to which he eventually fell asleep and started to snore. Upon introducing the glidescope the patient woke up and then the decision was made to paralyze the patient. ETT placed on first attempt.    Max Lin 07/23/2014

## 2014-07-23 NOTE — Procedures (Signed)
Extubation Procedure Note  Patient Details:   Name: Max FettersJuan Torres Lin DOB: [birth year] MRN: [id number]   Airway Documentation:     Evaluation  O2 sats: stable throughout Complications: No apparent complications Patient did tolerate procedure well. Bilateral Breath Sounds: Clear Suctioning: Airway Yes  Positive cuff leak,Placed on 3l/min [place] Vocalizes well  Newt LukesGroendal, Annaleigh Steinmeyer Ann 07/23/2014, 8:45 AM

## 2014-07-24 ENCOUNTER — Inpatient Hospital Stay (HOSPITAL_COMMUNITY): Payer: MEDICAID

## 2014-07-24 ENCOUNTER — Encounter (HOSPITAL_COMMUNITY): Payer: Self-pay | Admitting: Gastroenterology

## 2014-07-24 DIAGNOSIS — D62 Acute posthemorrhagic anemia: Secondary | ICD-10-CM

## 2014-07-24 DIAGNOSIS — I959 Hypotension, unspecified: Secondary | ICD-10-CM

## 2014-07-24 DIAGNOSIS — R0902 Hypoxemia: Secondary | ICD-10-CM

## 2014-07-24 LAB — CBC
HCT: 18 % — ABNORMAL LOW (ref 39.0–52.0)
Hemoglobin: 5.9 g/dL — CL (ref 13.0–17.0)
MCH: 24.9 pg — AB (ref 26.0–34.0)
MCHC: 32.8 g/dL (ref 30.0–36.0)
MCV: 75.9 fL — ABNORMAL LOW (ref 78.0–100.0)
Platelets: 50 10*3/uL — ABNORMAL LOW (ref 150–400)
RBC: 2.37 MIL/uL — ABNORMAL LOW (ref 4.22–5.81)
RDW: 17.7 % — ABNORMAL HIGH (ref 11.5–15.5)
WBC: 8.9 10*3/uL (ref 4.0–10.5)

## 2014-07-24 LAB — PREPARE FRESH FROZEN PLASMA
UNIT DIVISION: 0
Unit division: 0

## 2014-07-24 LAB — BASIC METABOLIC PANEL
Anion gap: 8 (ref 5–15)
BUN: 10 mg/dL (ref 6–20)
CO2: 24 mmol/L (ref 22–32)
Calcium: 7.4 mg/dL — ABNORMAL LOW (ref 8.9–10.3)
Chloride: 104 mmol/L (ref 101–111)
Creatinine, Ser: 0.62 mg/dL (ref 0.61–1.24)
GFR calc Af Amer: >60 mL/min (ref >60)
GFR calc non Af Amer: >60 mL/min (ref >60)
GLUCOSE: 101 mg/dL — AB (ref 70–99)
Potassium: 3.3 mmol/L — ABNORMAL LOW (ref 3.5–5.1)
SODIUM: 136 mmol/L (ref 135–145)

## 2014-07-24 LAB — LIPASE, BLOOD: Lipase: 33 U/L (ref 22–51)

## 2014-07-24 LAB — PHOSPHORUS: Phosphorus: 2 mg/dL — ABNORMAL LOW (ref 2.5–4.6)

## 2014-07-24 LAB — MAGNESIUM: MAGNESIUM: 1.5 mg/dL — AB (ref 1.7–2.4)

## 2014-07-24 LAB — PREPARE RBC (CROSSMATCH)

## 2014-07-24 LAB — AMYLASE: Amylase: 74 U/L (ref 28–100)

## 2014-07-24 MED ORDER — PANTOPRAZOLE SODIUM 40 MG IV SOLR
40.0000 mg | INTRAVENOUS | Status: DC
  Administered 2014-07-24 – 2014-07-26 (×3): 40 mg via INTRAVENOUS
  Filled 2014-07-24 (×4): qty 40

## 2014-07-24 MED ORDER — SODIUM CHLORIDE 0.9 % IV SOLN
Freq: Once | INTRAVENOUS | Status: AC
  Administered 2014-07-24: 06:00:00 via INTRAVENOUS

## 2014-07-24 NOTE — Progress Notes (Signed)
PULMONARY / CRITICAL CARE MEDICINE   Name: Max Lin MRN: [id number] DOB: [birth year]    ADMISSION DATE:  07/22/2014 CONSULTATION DATE:  07/22/14  REFERRING MD :  [facility]  CHIEF COMPLAINT:  GI bleed  INITIAL PRESENTATION: hematemesis  STUDIES: none  SIGNIFICANT EVENTS: s/p ETT placement for airway protection in light of endoscopic procedure  HISTORY OF PRESENT ILLNESS:  34yo Max Lin male who [facility] ED with complaints of hematemesis. Per report he had about 1L at home and then more in the ED. He became hypotensive at OSH and received IVF. I was able to briefly speak with him and he mentioned that he drank a substantial amount of beer, mostly Budweiser. He did not quantify how much. Per report he was also recently in the ED for alcohol detox.   SUBJECTIVE: continues to complain of epigastric pain but improving.  VITAL SIGNS: Temp:  [98 F (36.7 C)-98.7 F (37.1 C)] 98.6 F (37 C) (05/09 0915) Pulse Rate:  [90-137] 101 (05/09 0915) Resp:  [10-24] 19 (05/09 0915) BP: (85-155)/(34-123) 143/98 mmHg (05/09 0915) SpO2:  [93 %-100 %] 100 % (05/09 0915) Weight:  [83.9 kg (184 lb 15.5 oz)] 83.9 kg (184 lb 15.5 oz) (05/09 0422) HEMODYNAMICS:   VENTILATOR SETTINGS:   INTAKE / OUTPUT:  Intake/Output Summary (Last 24 hours) at 07/24/14 0929 Last data filed at 07/24/14 0900  Gross per 24 hour  Intake   4002 ml  Output   2440 ml  Net   1562 ml   PHYSICAL EXAMINATION: General:  Pt appears well nourished, well groomed, and his stated age.  Neuro:  awake HEENT:  [name]/AT, PERRL, EOMI, MMM. Cardiovascular:  s1s2 tachycardic, no M/r/g, no edema Lungs:  CTA bilaterally, no wheezes rhonchi Abdomen:  Soft, nontender nondistended +bowel sounds Musculoskeletal:  ROM intact, no atrophy noted Skin:  No lesions nor ulcers noted  LABS:  CBC  Recent Labs Lab 07/23/14 0437 07/23/14 1400 07/24/14 0415  WBC 16.7* 21.0* 8.9  HGB 8.0* 7.1* 5.9*  HCT 24.5*  21.4* 18.0*  PLT 72* 76* 50*   Coag's  Recent Labs Lab 07/19/14 0849 07/22/14 1921 07/23/14 0437  APTT 38* 32  --   INR 1.41 1.68* 1.60*   BMET  Recent Labs Lab 07/22/14 1921 07/22/14 1929 07/23/14 0321 07/24/14 0415  NA 136 137 136 136  K 3.7 3.7 5.0 3.3*  CL 106 102 111 104  CO2 23  --  20* 24  BUN CREATININE 0.89 1.00 0.78 0.62  GLUCOSE 144* 141* 161* 101*   Electrolytes  Recent Labs Lab 07/22/14 1921 07/23/14 0321 07/24/14 0415  CALCIUM 7.7* 6.6* 7.4*  MG  --  1.4* 1.5*  PHOS  --  4.4 2.0*   Sepsis Markers No results for input(s): LATICACIDVEN, PROCALCITON, O2SATVEN in the last 168 hours. ABG  Recent Labs Lab 07/23/14 0150  PHART 7.255*  PCO2ART 42.3  PO2ART 334*   Liver Enzymes  Recent Labs Lab 07/19/14 0849 07/22/14 1921 07/23/14 0321  AST 168* 103* 76*  ALT 49 41 32  ALKPHOS 117 109 80  BILITOT 1.4* 1.6* 2.1*  ALBUMIN 3.6 2.7* 2.3*   Cardiac Enzymes No results for input(s): TROPONINI, PROBNP in the last 168 hours. Glucose No results for input(s): GLUCAP in the last 168 hours.  Imaging US Abdomen Complete  07/24/2014   CLINICAL DATA:  New right lower quadrant abdominal pain.  Back pain.  EXAM: ULTRASOUND ABDOMEN COMPLETE  COMPARISON:  None.  FINDINGS: Gallbladder: There probably are multiple small calculi within the gallbladder lumen measuring up to 4 mm. There is no gallbladder wall thickening or pericholecystic fluid. The patient was not tender over the gallbladder.  Common bile duct: Diameter: 6 mm  Liver: There is slightly increased echogenicity of hepatic parenchyma consistent with fatty infiltration. There is no focal liver lesion. There is no intrahepatic bile duct dilatation.  IVC: No abnormality visualized.  Pancreas: Not visible.  Spleen: Upper limits normal in size.  Right Kidney: Length: 11.8 cm. Echogenicity within normal limits. No mass or hydronephrosis visualized.  Left Kidney: Length: 13.1 cm. Echogenicity  within normal limits. No mass or hydronephrosis visualized.  Abdominal aorta: Proximal and midportions are normal in size. The distal portion was not visible.  Other findings: None.  IMPRESSION: Probable cholelithiasis. Fatty liver. Nondiagnostic views of the pancreas and distal aorta. Upper normal splenic size.   Electronically Signed   By: Ellery Plunkaniel R Mitchell M.D.   On: 07/24/2014 01:45   Dg Chest Port 1 View  07/23/2014   CLINICAL DATA:  Reposition endotracheal tube  EXAM: PORTABLE CHEST - 1 VIEW  COMPARISON:  07/23/2014 at 00:42  FINDINGS: The endotracheal tube has been pulled back, and is now 4.6 cm above the carina. The right jugular central line extends into the right atrium. There is no pneumothorax. There is mild basilar opacity, left greater than right, unchanged.  IMPRESSION: Endotracheal tube has been repositioned as detailed above. No significant interval change in the bilateral airspace opacities.   Electronically Signed   By: Ellery Plunkaniel R Mitchell M.D.   On: 07/23/2014 02:08   Dg Chest Port 1 View  07/23/2014   CLINICAL DATA:  Central line placement  EXAM: PORTABLE CHEST - 1 VIEW  COMPARISON:  None.  FINDINGS: Endotracheal tube tip is 2 cm above the carina. There is a right jugular central line extending into the right atrium. There is no pneumothorax. There is left base consolidation. The right lung is clear.  IMPRESSION: Support equipment appears satisfactorily positioned.  No pneumothorax.  Left base consolidation   Electronically Signed   By: Ellery Plunkaniel R Mitchell M.D.   On: 07/23/2014 01:36   I reviewed CXR myself, line in good position, clear.  ASSESSMENT / PLAN:  PULMONARY OETT 7.5cm 21 to teeth A: acute respiratory failure 2/2 need for airway protection P:   - Titrate O2 to off.  CARDIOVASCULAR [facility]. IJ was pulled back several CM as it was lying the in right atrium A: Hypovolemic shock 2/2 GI bleed P:  - D/C pressors. - Transfuse.  RENAL A:  No acute issues P:    - BMET in AM. - Replace electrolytes as indicated.  GASTROINTESTINAL A:  Hemorrhagic Shock 2/2 GI bleed, Varices post banding 5/7 Alcoholic hepatitis with a discriminant function of 38.3.   P:   - PPI , octreotide , prednisone - Prednisone 40mg  po qd.    HEMATOLOGIC  Recent Labs  07/23/14 1400 07/24/14 0415  HGB 7.1* 5.9*    A:  Acute blood loss anemia 2/2 GI bleed, thrombocytopenia likely 2/2 EtOH abuse.  P: transfuse for Hb goal > 7. Serial Hb checks. Will hold on transfusing platelets at this time.  Transfuse one unit today.  INFECTIOUS A:  No active issues P:  S/p ceftriaxone x 1 for GI bleed in potential cirrhotic patient  ENDOCRINE A:  No active issues P:    NEUROLOGIC A:  EtOH abuse, active P:  Monitor for signs of withdrawal. Pt will be sedated with fentanyl and propofol. He will likely benefit from long acting benzodiazepines.  RASS goal: 0  FAMILY  - Updates: Patient updated bedside.  - Inter-disciplinary family meet or Palliative Care meeting due by:  day 7  Today's summary: Will continue steroids for now, transfuse as ordered above, [facility], PCCM will sign off 5/10.  Discussed with TRH MD.   Alyson ReedyWesam G. Dejane Scheibe, M.D. [facility] Pulmonary/Critical Care Medicine. Pager: [phone]. After hours pager: [phone].  07/24/2014, 9:29 AM

## 2014-07-24 NOTE — Progress Notes (Signed)
CRITICAL VALUE ALERT  Critical value received:  Hbg 5.9  Date of notification:  07/24/2014  Time of notification:  0450  Critical value read back:Yes.    Nurse who received alert:  Kristen CardinalLexi P  MD notified (1st page):  McQuaid, left message with nurse.  Time of first page:  0451  MD notified (2nd page):  Time of second page:  Responding MD:  Dr. Darlys GalesMcQuaid/ Elink RN Vinnie LangtonGretchen   Time MD responded:  [phone]

## 2014-07-24 NOTE — Progress Notes (Signed)
eLink Physician-Brief Progress Note Patient Name: Max FettersJuan Torres Lin DOB: 1980-09-09 MRN: [id number]   Date of Service  07/24/2014  HPI/Events of Note  Multiple issues: 1. Hgb = 5.9 abd 2. Patient c/o abdominal pain. Already on Octreotide IV infusion.   eICU Interventions  Will order: 1. Transfuse 2 units PRBC. 2. Portable Abdominal film. 3. Protonix 40 mg IV now and Q day. 4. Amylase and Lipase now.      Intervention Category Intermediate Interventions: Pain - evaluation and management  Zakari Couchman Eugene 07/24/2014, 5:16 AM

## 2014-07-24 NOTE — Progress Notes (Signed)
Patient ID: Max Lin, male   DOB: [birth year], 34 y.o.   MRN: [id number] [facility]  Max Lin 34 y.o. [birth year]   Subjective: No BMs overnight. No bleeding seen. Complaining of abdominal pain.  Objective: Vital signs in last 24 hours: Filed Vitals:   07/24/14 0830  BP: 123/66  Pulse: 94  Temp: 98.6 F (37 C)  Resp: 17    Physical Exam: Gen: alert, no acute distress CV: RRR Chest: CTA B Abd: epigastric tenderness with guarding, soft, nondistended, +BS Ext: no edema   Lab Results:  Recent Labs  07/23/14 0321 07/24/14 0415  NA 136 136  K 5.0 3.3*  CL 111 104  CO2 20* 24  GLUCOSE 161* 101*  BUN 15 10  CREATININE 0.78 0.62  CALCIUM 6.6* 7.4*  MG 1.4* 1.5*  PHOS 4.4 2.0*    Recent Labs  07/22/14 1921 07/23/14 0321  AST 103* 76*  ALT 41 32  ALKPHOS 109 80  BILITOT 1.6* 2.1*  PROT 6.7 5.4*  ALBUMIN 2.7* 2.3*    Recent Labs  07/22/14 1921  07/23/14 0437 07/23/14 1400 07/24/14 0415  WBC 12.6*  --  16.7* 21.0* 8.9  NEUTROABS 9.1*  --  13.0*  --   --   HGB 8.7*  < > 8.0* 7.1* 5.9*  HCT 27.0*  < > 24.5* 21.4* 18.0*  MCV 74.2*  --  75.6* 74.8* 75.9*  PLT 46*  --  72* 76* 50*  < > = values in this interval not displayed.  Recent Labs  07/22/14 1921 07/23/14 0437  LABPROT 20.0* 19.2*  INR 1.68* 1.60*      Assessment/Plan: 34 yo s/p variceal bleed with EVBL X 6 yesterday. Despite Hgb drop no evidence of ongoing bleeding. Complaining of a small amount of epigastric pain and pain with swallowing that is likely due to the recent bleeding and variceal banding. Will change to sips of water and ice chips only today and consider slowly advancing tomorrow if doing ok. Supportive care. Continue Octreotide. Receiving 2nd unit of PRBCs started this morning.    [name] C. 07/24/2014, 9:07 AM

## 2014-07-25 ENCOUNTER — Inpatient Hospital Stay (HOSPITAL_COMMUNITY): Payer: MEDICAID

## 2014-07-25 DIAGNOSIS — J948 Other specified pleural conditions: Secondary | ICD-10-CM

## 2014-07-25 DIAGNOSIS — K7010 Alcoholic hepatitis without ascites: Secondary | ICD-10-CM | POA: Diagnosis present

## 2014-07-25 DIAGNOSIS — J9811 Atelectasis: Secondary | ICD-10-CM | POA: Diagnosis present

## 2014-07-25 DIAGNOSIS — J90 Pleural effusion, not elsewhere classified: Secondary | ICD-10-CM | POA: Diagnosis present

## 2014-07-25 DIAGNOSIS — R571 Hypovolemic shock: Secondary | ICD-10-CM

## 2014-07-25 DIAGNOSIS — M25559 Pain in unspecified hip: Secondary | ICD-10-CM

## 2014-07-25 DIAGNOSIS — I8501 Esophageal varices with bleeding: Principal | ICD-10-CM

## 2014-07-25 DIAGNOSIS — F1010 Alcohol abuse, uncomplicated: Secondary | ICD-10-CM

## 2014-07-25 DIAGNOSIS — M549 Dorsalgia, unspecified: Secondary | ICD-10-CM

## 2014-07-25 LAB — TYPE AND SCREEN
ABO/RH(D): O POS
ABO/RH(D): O POS
ANTIBODY SCREEN: NEGATIVE
Antibody Screen: NEGATIVE
UNIT DIVISION: 0
UNIT DIVISION: 0
Unit division: 0
Unit division: 0

## 2014-07-25 LAB — CBC
HCT: 24 % — ABNORMAL LOW (ref 39.0–52.0)
HEMOGLOBIN: 8.1 g/dL — AB (ref 13.0–17.0)
MCH: 25.8 pg — AB (ref 26.0–34.0)
MCHC: 33.8 g/dL (ref 30.0–36.0)
MCV: 76.4 fL — ABNORMAL LOW (ref 78.0–100.0)
Platelets: 71 10*3/uL — ABNORMAL LOW (ref 150–400)
RBC: 3.14 MIL/uL — ABNORMAL LOW (ref 4.22–5.81)
RDW: 18 % — ABNORMAL HIGH (ref 11.5–15.5)
WBC: 7.6 10*3/uL (ref 4.0–10.5)

## 2014-07-25 LAB — BASIC METABOLIC PANEL
ANION GAP: 4 — AB (ref 5–15)
BUN: 7 mg/dL (ref 6–20)
CHLORIDE: 103 mmol/L (ref 101–111)
CO2: 27 mmol/L (ref 22–32)
Calcium: 7.8 mg/dL — ABNORMAL LOW (ref 8.9–10.3)
Creatinine, Ser: 0.62 mg/dL (ref 0.61–1.24)
GFR calc Af Amer: >60 mL/min (ref >60)
GFR calc non Af Amer: >60 mL/min (ref >60)
Glucose, Bld: 81 mg/dL (ref 70–99)
Potassium: 3.3 mmol/L — ABNORMAL LOW (ref 3.5–5.1)
Sodium: 134 mmol/L — ABNORMAL LOW (ref 135–145)

## 2014-07-25 LAB — PHOSPHORUS: PHOSPHORUS: 2.9 mg/dL (ref 2.5–4.6)

## 2014-07-25 LAB — MAGNESIUM: Magnesium: 1.6 mg/dL — ABNORMAL LOW (ref 1.7–2.4)

## 2014-07-25 MED ORDER — VITAMIN B-1 100 MG PO TABS
100.0000 mg | ORAL_TABLET | Freq: Every day | ORAL | Status: DC
  Administered 2014-07-25 – 2014-07-27 (×3): 100 mg via ORAL
  Filled 2014-07-25 (×4): qty 1

## 2014-07-25 MED ORDER — POTASSIUM CHLORIDE 10 MEQ/50ML IV SOLN
10.0000 meq | INTRAVENOUS | Status: AC
  Administered 2014-07-25 (×6): 10 meq via INTRAVENOUS
  Filled 2014-07-25 (×6): qty 50

## 2014-07-25 MED ORDER — FOLIC ACID 1 MG PO TABS
1.0000 mg | ORAL_TABLET | Freq: Every day | ORAL | Status: DC
Start: 2014-07-25 — End: 2014-07-27
  Administered 2014-07-25 – 2014-07-27 (×3): 1 mg via ORAL
  Filled 2014-07-25 (×4): qty 1

## 2014-07-25 MED ORDER — FENTANYL CITRATE (PF) 100 MCG/2ML IJ SOLN
25.0000 ug | INTRAMUSCULAR | Status: DC | PRN
  Administered 2014-07-26: 50 ug via INTRAVENOUS
  Administered 2014-07-27: 25 ug via INTRAVENOUS
  Filled 2014-07-25 (×2): qty 2

## 2014-07-25 MED ORDER — MAGNESIUM SULFATE 2 GM/50ML IV SOLN
2.0000 g | Freq: Once | INTRAVENOUS | Status: AC
  Administered 2014-07-25: 2 g via INTRAVENOUS
  Filled 2014-07-25: qty 50

## 2014-07-25 MED ORDER — GI COCKTAIL ~~LOC~~
30.0000 mL | Freq: Three times a day (TID) | ORAL | Status: DC | PRN
  Filled 2014-07-25: qty 30

## 2014-07-25 MED ORDER — SODIUM CHLORIDE 0.9 % IV SOLN
INTRAVENOUS | Status: DC
  Administered 2014-07-25: 11:00:00 via INTRAVENOUS

## 2014-07-25 MED ORDER — LORAZEPAM 2 MG/ML IJ SOLN: 2.0000 mg | INTRAMUSCULAR | Status: DC | PRN

## 2014-07-25 NOTE — Progress Notes (Signed)
[place] TEAM 1 - Stepdown/ICU TEAM Progress Note  Max FettersJuan Torres Lin ZOX:[id number] DOB: 1980/07/19 DOA: 07/22/2014 PCP: No PCP Per Patient  Admit HPI / Brief Narrative: 34yo [name] (primary language Spanish) PMHx substance abuse (alcoho drinks a substantial amount of beer, mostly Budweiser, cocaine); no further history available.  who [facility] with complaints of hematemesis. Per report he had about 1L at home and then more in the ED. He became hypotensive at OSH and received IVF. I was able to briefly speak with him and he mentioned that he drank a substantial amount of beer, mostly Budweiser. He did not quantify how much. Per report he was also recently in the ED for alcohol detox.    HPI/Subjective: 5/10 A/O 4, complains of epigastric, substernal chest pain. States having lower back pain/bilateral hip pain which is acute not associated with any trauma.   Assessment/Plan: Hypovolemic shock 2/2 GI bleed/esophageal varices -5/7 Transfused 2 units PRBC -5/9 Transfused 2 units PRBC  -Stable  Hemorrhagic Shock 2/2 GI bleed/acute blood loss anemia, - 5/8 Varices post banding  -Hemoglobin stable -Protonix 40 mg daily -Octreotide drip -Transfuse for Hb<7  Post banding pain -GI cocktail -Fentanyl 25-50 g PRN  Alcoholic hepatitis with a discriminant function of 38.3.  -Prednisolone 40mg  po qd.  Bibasilar atelectasis/infiltrates/left pleural effusion -Currently patient asymptomatic monitor closely for increased WOB, oxygen requirement  EtOH abuse, active/ -CIWA protocol   Back/hip pain -L-spine/hip x-ray pending   Code Status: FULL Family Communication: no family present at time of exam Disposition Plan: Per GI    Consultants: Dr.Vincent Bosie ClosSchooler Deboraha Sprang([facility]) Dr.Wesam Santa GeneraG Yacoub [facility])   Procedure/Significant Events: -5/7 Transfused 2 units PRBC 5/8 abdominal ultrasound;Probable cholelithiasis. Fatty liver.  -5/8 Upper endoscopy;  Large esophageal varices,  6 bands applied -5/9 Transfused 2 units PRBC  -5/9 PCXR; - Persistent bibasilar atelectasis and/or infiltrates; prominent Lt lung base. -Small Lt pleural effusion.   Culture NA  Antibiotics: NA  DVT prophylaxis: SCD   Devices NA   LINES / TUBES:  [facility]    Continuous Infusions: . sodium chloride 10 mL (07/25/14 2000)  . octreotide  (SANDOSTATIN)    IV infusion 50 mcg/hr (07/25/14 2000)    Objective: VITAL SIGNS: Temp: 98.6 F (37 C) (05/10 1925) Temp Source: Oral (05/10 1925) BP: 124/76 mmHg (05/10 1925) Pulse Rate: 80 (05/10 1925) SPO2; FIO2:   Intake/Output Summary (Last 24 hours) at 07/25/14 2054 Last data filed at 07/25/14 2000  Gross per 24 hour  Intake   2965 ml  Output   6100 ml  Net  -3135 ml     Exam: General: A/O 4, hip and back pain, No acute respiratory distress Lungs: Clear to auscultation bilaterally without wheezes or crackles Cardiovascular: Regular rate and rhythm without murmur gallop or rub normal S1 and S2 Abdomen: Nontender, nondistended, soft, bowel sounds positive, no rebound, no ascites, no appreciable mass Extremities: No significant cyanosis, clubbing, or edema bilateral lower extremities. Pain to palpation bilateral inguinal area. Back; tender to palpation L spine/sacrum area  Data Reviewed: Basic Metabolic Panel:  Recent Labs Lab 07/19/14 0849 07/22/14 1921 07/22/14 1929 07/23/14 0321 07/24/14 0415 07/25/14 0315  NA 138 136 137 136 136 134*  K 3.5 3.7 3.7 5.0 3.3* 3.3*  CL 103 106 102 111 104 103  CO2 25 23  --  20* 24 27  GLUCOSE 105* 144* 141* 161* 101* 81  BUN 6 17 17 15 10  7  CREATININE 0.59* 0.89 1.00 0.78 0.62 0.62  CALCIUM 8.1* 7.7*  --  6.6* 7.4* 7.8*  MG  --   --   --  1.4* 1.5* 1.6*  PHOS  --   --   --  4.4 2.0* 2.9   Liver Function Tests:  Recent Labs Lab 07/19/14 0849 07/22/14 1921 07/23/14 0321  AST 168* 103* 76*  ALT 49 41 32  ALKPHOS 117 109 80    BILITOT 1.4* 1.6* 2.1*  PROT 8.7* 6.7 5.4*  ALBUMIN 3.6 2.7* 2.3*    Recent Labs Lab 07/19/14 0849 07/24/14 0530  LIPASE 41 33  AMYLASE  --  74   No results for input(s): AMMONIA in the last 168 hours. CBC:  Recent Labs Lab 07/19/14 0849 07/22/14 1921 07/22/14 1929 07/23/14 0437 07/23/14 1400 07/24/14 0415 07/25/14 0315  WBC 5.7 12.6*  --  16.7* 21.0* 8.9 7.6  NEUTROABS 3.4 9.1*  --  13.0*  --   --   --   HGB 11.4* 8.7* 10.9* 8.0* 7.1* 5.9* 8.1*  HCT 35.3* 27.0* 32.0* 24.5* 21.4* 18.0* 24.0*  MCV 71.9* 74.2*  --  75.6* 74.8* 75.9* 76.4*  PLT 26* 46*  --  72* 76* 50* 71*   Cardiac Enzymes: No results for input(s): CKTOTAL, CKMB, CKMBINDEX, TROPONINI in the last 168 hours. BNP (last 3 results) No results for input(s): BNP in the last 8760 hours.  ProBNP (last 3 results) No results for input(s): PROBNP in the last 8760 hours.  CBG: No results for input(s): GLUCAP in the last 168 hours.  Recent Results (from the past 240 hour(s))  MRSA PCR Screening     Status: None   Collection Time: 07/22/14 11:46 PM  Result Value Ref Range Status   MRSA by PCR NEGATIVE NEGATIVE Final    Comment:        The GeneXpert MRSA Assay (FDA approved for NASAL specimens only), is one component of a comprehensive MRSA colonization surveillance program. It is not intended to diagnose MRSA infection nor to guide or monitor treatment for MRSA infections.      Studies:  Recent x-ray studies have been reviewed in detail by the Attending Physician  Scheduled Meds:  Scheduled Meds: . folic acid  1 mg Oral Daily  . octreotide  50 mcg Intravenous Once  . pantoprazole (PROTONIX) IV  40 mg Intravenous Q24H  . prednisoLONE  40 mg Oral QAC breakfast  . thiamine  100 mg Oral Daily    Time spent on care of this patient: 40 mins   WOODS, Roselind MessierURTIS J , MD  [facility] Office  [phone] Pager - [phone]  On-Call/Text Page:      [email or web address]      password [email or web address]  If  7PM-7AM, please contact night-coverage [email or web address] Password [email or web address] 07/25/2014, 8:54 PM   LOS: 3 days   Care during the described time interval was provided by me .  I have reviewed this patient's available data, including medical history, events of note, physical examination, radiology studies and test results as part of my evaluation  Carolyne Littlesurtis Woods, MD [phone] Pager

## 2014-07-25 NOTE — Progress Notes (Addendum)
Spanish translator called.  Pt understands little english.  Alert oriented x4.  Call bell in reach.  No pain at this time. Will continue to monitor. Chart was showing blood product not completed with pop up screen.  Verified blood completed on 5/9 0830.  Completed order for unit # [id number] in chart 07/25/14 at1920 .  Karena Addisonoro, Dorthea Maina T

## 2014-07-25 NOTE — Evaluation (Signed)
Physical Therapy Evaluation Patient Details Name: Max Lin MRN: [id number] DOB: August 25, 1980 Today's Date: 07/25/2014   History of Present Illness  33yo [name] who [facility] with complaints of hematemesis after drinking beer  Clinical Impression  Pt very pleasant man who reports increasing dizziness and feeling weak for the last 9 months with excessive activity. 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 He reports blurry vision with close or far vision started 15 days ago. Pt with negative supine head roll as well as negative bil [employer] without further clear vestibular symptoms or testing performed. Pt reports getting dizzy with bending over and walking long distance but unable to replicate at this time. Pt with BP 131/76 supine, 151/92 sitting, 164/94 standing. Pt with generalized weakness and balance deficits with higher level balance activities who will benefit from acute therapy to maximize mobility and function. Pt educated for seated marching and kicking to increase strength as well as recommendation for OPPT to further address balance and strength deficits at D/C.    Follow Up Recommendations Outpatient PT    Equipment Recommendations  None recommended by PT    Recommendations for Other Services       Precautions / Restrictions Precautions Precautions: Fall      Mobility  Bed Mobility Overal bed mobility: Modified Independent                Transfers Overall transfer level: Modified independent                  Ambulation/Gait Ambulation/Gait assistance: Modified independent (Device/Increase time) Ambulation Distance (Feet): 400 Feet Assistive device: None Gait Pattern/deviations: WFL(Within Functional Limits)     General Gait Details: generally steady gait but decreased endurance  Stairs            Wheelchair Mobility    Modified Rankin (Stroke Patients Only)       Balance Overall  balance assessment: Needs assistance   Sitting balance-Leahy Scale: Normal       Standing balance-Leahy Scale: Good   Single Leg Stance - Right Leg: 5 Single Leg Stance - Left Leg: 6 Tandem Stance - Right Leg: 5 Tandem Stance - Left Leg: 4 Rhomberg - Eyes Opened: 60 Rhomberg - Eyes Closed: 20   High Level Balance Comments: pt able to turn 360 degrees bil in less than 4 sec,              Pertinent Vitals/Pain Pain Assessment: 0-10 Pain Score: 3  Pain Location: HA Pain Descriptors / Indicators: Aching    Home Living Family/patient expects to be discharged to:: Private residence Living Arrangements: Non-relatives/Friends Available Help at Discharge: Friend(s);Available PRN/intermittently Type of Home: House Home Access: Stairs to enter   Entrance Stairs-Number of Steps: 2 Home Layout: One level Home Equipment: None      Prior Function Level of Independence: Independent               Hand Dominance        Extremity/Trunk Assessment   Upper Extremity Assessment: Generalized weakness           Lower Extremity Assessment: RLE deficits/detail;LLE deficits/detail RLE Deficits / Details: hip flexion 3+/5, knee extension 3+/5, knee flexion 3+/5 bil LLE Deficits / Details: hip flexion 3+/5, knee extension 3+/5, knee flexion 3+/5 bil  Cervical / Trunk Assessment: Normal  Communication   Communication: Prefers language other than English;Interpreter utilized [employer](Interpreter [place] [phone]#[id number])  Cognition Arousal/Alertness: Awake/alert Behavior During  Therapy: WFL for tasks assessed/performed Overall Cognitive Status: Within Functional Limits for tasks assessed                      General Comments      Exercises        Assessment/Plan    PT Assessment Patient needs continued PT services  PT Diagnosis Generalized weakness   PT Problem List Decreased strength;Decreased balance;Decreased activity tolerance  PT Treatment Interventions Gait  training;Functional mobility training;Therapeutic exercise;Balance training;Patient/family education   PT Goals (Current goals can be found in the Care Plan section) Acute Rehab PT Goals Patient Stated Goal: be able to walk without feeling dizzy or weak PT Goal Formulation: With patient Time For Goal Achievement: 08/08/14 Potential to Achieve Goals: Good    Frequency Min 3X/week   Barriers to discharge Decreased caregiver support      Co-evaluation               End of Session   Activity Tolerance: Patient tolerated treatment well Patient left: in chair;with call bell/phone within reach;Other (comment) (financial counselor with pt end of session)           Time: 1006-1050 PT Time Calculation (min) (ACUTE ONLY): 44 min   Charges:   PT Evaluation $Initial PT Evaluation Tier I: 1 Procedure PT Treatments $Therapeutic Activity: 8-22 mins   PT G CodesDelorse Lek:        Tabor, Harriet Bollen Beth 07/25/2014, 11:12 AM Delaney MeigsMaija Tabor Airabella Barley, PT [phone]

## 2014-07-25 NOTE — Progress Notes (Signed)
Patient ID: Max Lin, male   DOB: 23-Sep-1980, 34 y.o.   MRN: [id number] [facility] Progress Note  Max Lin 33 y.o. 23-Sep-1980   Subjective: Complaining of mild chest pain and epigastric pain this morning that is better than it was yesterday. PT at bedside during my evaluation this morning and translator on the phone.  Objective: Vital signs in last 24 hours: Filed Vitals:   07/25/14 1131  BP: 119/83  Pulse: 89  Temp: 98.7 F (37.1 C)  Resp: 22    Physical Exam: Gen: alert, no acute distress CV: RRR Chest: CTA B Abd: epigastric tenderness with guarding, soft, nondistended, +BS  Lab Results:  Recent Labs  07/24/14 0415 07/25/14 0315  NA 136 134*  K 3.3* 3.3*  CL 104 103  CO2 24 27  GLUCOSE 101* 81  BUN 10 7  CREATININE 0.62 0.62  CALCIUM 7.4* 7.8*  MG 1.5* 1.6*  PHOS 2.0* 2.9    Recent Labs  07/22/14 1921 07/23/14 0321  AST 103* 76*  ALT 41 32  ALKPHOS 109 80  BILITOT 1.6* 2.1*  PROT 6.7 5.4*  ALBUMIN 2.7* 2.3*    Recent Labs  07/22/14 1921  07/23/14 0437  07/24/14 0415 07/25/14 0315  WBC 12.6*  --  16.7*  < > 8.9 7.6  NEUTROABS 9.1*  --  13.0*  --   --   --   HGB 8.7*  < > 8.0*  < > 5.9* 8.1*  HCT 27.0*  < > 24.5*  < > 18.0* 24.0*  MCV 74.2*  --  75.6*  < > 75.9* 76.4*  PLT 46*  --  72*  < > 50* 71*  < > = values in this interval not displayed.  Recent Labs  07/22/14 1921 07/23/14 0437  LABPROT 20.0* 19.2*  INR 1.68* 1.60*      Assessment/Plan: S/P Variceal bleed with band ligation X 6. Chest pain likely due to recent band ligation. Hgb 8.1 following transfusion of 2 U PRBCs yesterday. No further bleeding. Continue Octreotide drip. Start clear liquid diet. Supportive care. Electrolyte repletion per primary team.   Aeden Matranga C. 07/25/2014, 1:33 PM  Pager [phone]  If no answer or after 5 PM call [phone]

## 2014-07-25 NOTE — Progress Notes (Signed)
eLink Physician-Brief Progress Note Patient Name: Max Lin DOB: 08-Nov-1980 MRN: [id number]   Date of Service  07/25/2014  HPI/Events of Note  Hypokalemia and hypomag  eICU Interventions  Potassium and Magnesium replaced     Intervention Category Intermediate Interventions: Electrolyte abnormality - evaluation and management  Kermit Arnette 07/25/2014, 4:16 AM

## 2014-07-26 DIAGNOSIS — E876 Hypokalemia: Secondary | ICD-10-CM

## 2014-07-26 DIAGNOSIS — K922 Gastrointestinal hemorrhage, unspecified: Secondary | ICD-10-CM

## 2014-07-26 LAB — CBC
HEMATOCRIT: 26.8 % — AB (ref 39.0–52.0)
HEMOGLOBIN: 9.1 g/dL — AB (ref 13.0–17.0)
MCH: 26.3 pg (ref 26.0–34.0)
MCHC: 34 g/dL (ref 30.0–36.0)
MCV: 77.5 fL — AB (ref 78.0–100.0)
Platelets: 106 10*3/uL — ABNORMAL LOW (ref 150–400)
RBC: 3.46 MIL/uL — AB (ref 4.22–5.81)
RDW: 18.6 % — ABNORMAL HIGH (ref 11.5–15.5)
WBC: 7.7 10*3/uL (ref 4.0–10.5)

## 2014-07-26 MED ORDER — MECLIZINE HCL 25 MG PO TABS
25.0000 mg | ORAL_TABLET | Freq: Three times a day (TID) | ORAL | Status: DC | PRN
  Filled 2014-07-26: qty 1

## 2014-07-26 MED ORDER — MAGNESIUM SULFATE 2 GM/50ML IV SOLN
2.0000 g | Freq: Once | INTRAVENOUS | Status: AC
  Administered 2014-07-26: 2 g via INTRAVENOUS
  Filled 2014-07-26: qty 50

## 2014-07-26 MED ORDER — PANTOPRAZOLE SODIUM 40 MG PO TBEC
40.0000 mg | DELAYED_RELEASE_TABLET | Freq: Every day | ORAL | Status: DC
  Administered 2014-07-27: 40 mg via ORAL
  Filled 2014-07-26: qty 1

## 2014-07-26 MED ORDER — ACETAMINOPHEN 325 MG PO TABS
650.0000 mg | ORAL_TABLET | Freq: Four times a day (QID) | ORAL | Status: DC | PRN
  Administered 2014-07-26: 650 mg via ORAL
  Filled 2014-07-26: qty 2

## 2014-07-26 MED ORDER — POTASSIUM CHLORIDE CRYS ER 20 MEQ PO TBCR
40.0000 meq | EXTENDED_RELEASE_TABLET | Freq: Two times a day (BID) | ORAL | Status: DC
  Administered 2014-07-26 – 2014-07-27 (×2): 40 meq via ORAL
  Filled 2014-07-26 (×4): qty 2

## 2014-07-26 NOTE — Progress Notes (Addendum)
Max Lin - Stepdown/ICU TEAM Progress Note  Max Lin [id number] DOB: April 30, 1980 DOA: 07/22/2014 PCP: No PCP Per Patient  Admit HPI / Brief Narrative: 34yo Max Lin male (primary language Spanish) with a Hx of substance abuse (substantial amount of beer + cocaine) who [facility] with complaints of hematemesis. Per report he had about 1L at home and then more in the ED. He became hypotensive at OSH and received IVF.   HPI/Subjective: The patient denies abdominal pain and states that he is quite hungry and anxious to begin eating.  He denies nausea or vomiting but does complain of vertigo on an intermittent basis that was going home prior to his admission which sometimes become so severe that it leads to vomiting.  He denies chest pain or shortness of breath.  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  Prednisolone 40mg  po qd for 28 days followed by a taper over 2-3 weeks  Bibasilar atelectasis / left pleural effusion Currently patient asymptomatic with saturation of 100% on room air  Polysubstance abuse - EtOH + cocaine  CIWA protocol - EtOH 316 at time of admit - UDS + cocaine at admit - no evidence of withdrawal at this time  Back/hip pain L-spine x-ray w/o acute findings   Hypokalemia Not yet at goal - cont to supplement   Hypomagnesemia Not yet at goal - cont to supplement   Hypophosphatemia  Improved w/ supplementation   Vertigo Could be related to cerebellar damage due to excessive alcohol abuse - check B-12 and folic acid levels - patient had an unrevealing CT scan of the head  07/19/2014  Code Status: FULL Family Communication: no family present at time of exam Disposition Plan: Transfer to medical bed - ambulate - continue to replace electrolytes - advance diet - probable discharge in a.m.  Consultants: [facility]  Procedures: -5/7 Transfused 2 units PRBC -5/8 abdominal ultrasound Probable cholelithiasis. Fatty liver.  -5/8 Upper endoscopy; Large esophageal varices,  6 bands applied -5/9 Transfused 2 units PRBC   Antibiotics: NA  DVT prophylaxis: SCD  Objective: Blood pressure 127/79, pulse 75, temperature 98.Lin F (36.7 C), temperature source Oral, resp. rate 21, height 5\' 6"  (Lin.676 m), weight 82.7 kg (182 lb 5.Lin oz), SpO2 100 %.  Intake/Output Summary (Last 24 hours) at 07/26/14 1642 Last data filed at 07/26/14 1432  Gross per 24 hour  Intake   2003 ml  Output   4950 ml  Net  -2947 ml   Exam: General: No acute respiratory distress Lungs: Clear to auscultation bilaterally without wheezes or crackles Cardiovascular: Regular rate and rhythm without murmur gallop or rub Abdomen: Nontender, nondistended, soft, bowel sounds positive, no rebound, no ascites, no appreciable mass Extremities: No significant cyanosis, clubbing, or edema bilateral lower extremities  Data Reviewed: Basic Metabolic Panel:  Recent Labs Lab 07/22/14 1921 07/22/14 1929 07/23/14 0321 07/24/14 0415 07/25/14 0315  NA 136 137 136 136 134*  K 3.7 3.7 5.0 3.3* 3.3*  CL 106 102 111 104 103  CO2 23  --  20* 24 27  GLUCOSE 144* 141* 161* 101* 81  BUN  17 17 15 10 7   CREATININE 0.89 Lin.00 0.78 0.62 0.62  CALCIUM 7.7*  --  6.6* 7.4* 7.8*  MG  --   --  Lin.4* Lin.5* Lin.6*  PHOS  --   --  4.4 2.0* 2.9   Liver Function Tests:  Recent Labs Lab 07/22/14 1921 07/23/14 0321  AST 103* 76*  ALT 41 32  ALKPHOS 109 80  BILITOT Lin.6* 2.Lin*  PROT 6.7 5.4*  ALBUMIN 2.7* 2.3*    Recent Labs Lab 07/24/14 0530  LIPASE 33  AMYLASE 74   CBC:  Recent Labs Lab  07/22/14 1921  07/23/14 0437 07/23/14 1400 07/24/14 0415 07/25/14 0315 07/26/14 0915  WBC 12.6*  --  16.7* 21.0* 8.9 7.6 7.7  NEUTROABS 9.Lin*  --  13.0*  --   --   --   --   HGB 8.7*  < > 8.0* 7.Lin* 5.9* 8.Lin* 9.Lin*  HCT 27.0*  < > 24.5* 21.4* 18.0* 24.0* 26.8*  MCV 74.2*  --  75.6* 74.8* 75.9* 76.4* 77.5*  PLT 46*  --  72* 76* 50* 71* 106*  < > = values in this interval not displayed.   Recent Results (from the past 240 hour(s))  MRSA PCR Screening     Status: None   Collection Time: 07/22/14 11:46 PM  Result Value Ref Range Status   MRSA by PCR NEGATIVE NEGATIVE Final    Comment:        The GeneXpert MRSA Assay (FDA approved for NASAL specimens only), is one component of a comprehensive MRSA colonization surveillance program. It is not intended to diagnose MRSA infection nor to guide or monitor treatment for MRSA infections.      Studies:  Recent x-ray studies have been reviewed in detail by the Attending Physician  Scheduled Meds:  Scheduled Meds: . folic acid  Lin mg Oral Daily  . pantoprazole (PROTONIX) IV  40 mg Intravenous Q24H  . prednisoLONE  40 mg Oral QAC breakfast  . thiamine  100 mg Oral Daily    Time spent on care of this patient: 35 mins  Max BloodJeffrey T. McClung, MD [facility] For Consults/Admissions - Flow Manager - [phone] Office  [phone]  Contact MD directly via text page:      [email or web address]      password [email or web address]  07/26/2014, 4:42 PM   LOS: 4 days

## 2014-07-26 NOTE — Care Management Note (Signed)
Case Management Note  Patient Details  Name: Max FettersJuan Torres Lin MRN: [id number] Date of Birth: 06/15/1980  Subjective/Objective:    Adm w gi bleed               Action/Plan:lives w friend   Expected Discharge Date:                  Expected Discharge Plan:     In-House Referral:     Discharge planning Services     Post Acute Care Choice:    Choice offered to:     DME Arranged:    DME Agency:     HH Arranged:    HH Agency:     Status of Service:     Medicare Important Message Given:    Date Medicare IM Given:    Medicare IM give by:    Date Additional Medicare IM Given:    Additional Medicare Important Message give by:     If discussed at Long Length of Stay Meetings, dates discussed:  07/27/14  Additional Comments:  Hanley HaysDowell, Shalin Vonbargen T, RN 07/26/2014, 10:16 AM

## 2014-07-26 NOTE — Progress Notes (Signed)
Patient ID: Max Lin, male   DOB: 1981/01/20, 34 y.o.   MRN: [id number] [facility] Progress Note  Max Lin 34 y.o. 1981/01/20   Subjective: Complaining of mild epigastric pain. Tolerating clears. No bleeding. No BMs.  Objective: Vital signs in last 24 hours: Filed Vitals:   07/26/14 0819  BP: 127/74  Pulse: 78  Temp: 98.7 F (37.1 C)  Resp: 19    Physical Exam: Gen: alert, no acute distress CV: RRR Chest: CTA B Abd: minimal epigastric tenderness without guarding, soft, nondistended, +BS  Lab Results:  Recent Labs  07/24/14 0415 07/25/14 0315  NA 136 134*  K 3.3* 3.3*  CL 104 103  CO2 24 27  GLUCOSE 101* 81  BUN 10 7  CREATININE 0.62 0.62  CALCIUM 7.4* 7.8*  MG 1.5* 1.6*  PHOS 2.0* 2.9   No results for input(s): AST, ALT, ALKPHOS, BILITOT, PROT, ALBUMIN in the last 72 hours.  Recent Labs  07/24/14 0415 07/25/14 0315  WBC 8.9 7.6  HGB 5.9* 8.1*  HCT 18.0* 24.0*  MCV 75.9* 76.4*  PLT 50* 71*   No results for input(s): LABPROT, INR in the last 72 hours.    Assessment/Plan: S/P Variceal bleed with band ligation X 6 on 07/23/14 without any recurrent bleeding. Will d/c Octreotide drip. Change to full liquids and advance diet as tolerated to low sodium diet. Follow H/Hs. Will sign off. Call if questions.   Montie Gelardi C. 07/26/2014, 8:56 AM  Pager [phone]  If no answer or after 5 PM call [phone]

## 2014-07-27 DIAGNOSIS — M25552 Pain in left hip: Secondary | ICD-10-CM

## 2014-07-27 DIAGNOSIS — M25551 Pain in right hip: Secondary | ICD-10-CM

## 2014-07-27 LAB — CBC
HCT: 26.8 % — ABNORMAL LOW (ref 39.0–52.0)
HEMOGLOBIN: 8.9 g/dL — AB (ref 13.0–17.0)
MCH: 25.6 pg — AB (ref 26.0–34.0)
MCHC: 33.2 g/dL (ref 30.0–36.0)
MCV: 77 fL — ABNORMAL LOW (ref 78.0–100.0)
Platelets: 103 10*3/uL — ABNORMAL LOW (ref 150–400)
RBC: 3.48 MIL/uL — ABNORMAL LOW (ref 4.22–5.81)
RDW: 18.4 % — ABNORMAL HIGH (ref 11.5–15.5)
WBC: 6.7 10*3/uL (ref 4.0–10.5)

## 2014-07-27 LAB — COMPREHENSIVE METABOLIC PANEL
ALBUMIN: 2.5 g/dL — AB (ref 3.5–5.0)
ALT: 61 U/L (ref 17–63)
AST: 98 U/L — AB (ref 15–41)
Alkaline Phosphatase: 87 U/L (ref 38–126)
Anion gap: 8 (ref 5–15)
BUN: <5 mg/dL — ABNORMAL LOW (ref 6–20)
CHLORIDE: 103 mmol/L (ref 101–111)
CO2: 25 mmol/L (ref 22–32)
Calcium: 7.9 mg/dL — ABNORMAL LOW (ref 8.9–10.3)
Creatinine, Ser: 0.64 mg/dL (ref 0.61–1.24)
GFR calc Af Amer: >60 mL/min (ref >60)
Glucose, Bld: 87 mg/dL (ref 65–99)
Potassium: 3.4 mmol/L — ABNORMAL LOW (ref 3.5–5.1)
Sodium: 136 mmol/L (ref 135–145)
Total Bilirubin: 1.6 mg/dL — ABNORMAL HIGH (ref 0.3–1.2)
Total Protein: 6.5 g/dL (ref 6.5–8.1)

## 2014-07-27 LAB — FOLATE: Folate: 22 ng/mL (ref >5.9)

## 2014-07-27 LAB — PROTIME-INR
INR: 1.45 (ref 0.00–1.49)
Prothrombin Time: 17.7 seconds — ABNORMAL HIGH (ref 11.6–15.2)

## 2014-07-27 LAB — MAGNESIUM: Magnesium: 1.9 mg/dL (ref 1.7–2.4)

## 2014-07-27 LAB — VITAMIN B12: VITAMIN B 12: 2351 pg/mL — AB (ref 180–914)

## 2014-07-27 MED ORDER — PANTOPRAZOLE SODIUM 40 MG PO TBEC: 40.0000 mg | DELAYED_RELEASE_TABLET | Freq: Every day | ORAL | Status: DC

## 2014-07-27 MED ORDER — FOLIC ACID 1 MG PO TABS
1.0000 mg | ORAL_TABLET | Freq: Every day | ORAL | Status: DC
Start: 2014-07-27 — End: 2014-08-16

## 2014-07-27 MED ORDER — OXYCODONE HCL 5 MG/5ML PO SOLN: 5.0000 mg | Freq: Three times a day (TID) | ORAL | Status: DC | PRN

## 2014-07-27 MED ORDER — OXYCODONE HCL 5 MG PO TABS: 5.0000 mg | ORAL_TABLET | Freq: Three times a day (TID) | ORAL | Status: DC | PRN

## 2014-07-27 MED ORDER — PREDNISOLONE 15 MG/5ML PO SOLN: 40.0000 mg | Freq: Every day | ORAL | Status: DC

## 2014-07-27 MED ORDER — GI COCKTAIL ~~LOC~~: 30.0000 mL | Freq: Three times a day (TID) | ORAL | Status: DC | PRN

## 2014-07-27 MED ORDER — THIAMINE HCL 100 MG PO TABS: 100.0000 mg | ORAL_TABLET | Freq: Every day | ORAL | Status: DC

## 2014-07-27 NOTE — Progress Notes (Signed)
Patient arrived on floor approx 0520. No acute distress noted.

## 2014-07-27 NOTE — Progress Notes (Signed)
Physical Therapy Treatment Patient Details Name: Max Lin MRN: [id number] DOB: 11-24-1980 Today's Date: 07/27/2014    History of Present Illness 34yo [place] male who presented to the [facility] ED with complaints of hematemesis after drinking beer    PT Comments    Noting improving balance, with Dynamic Gait Index score of 23/24; practiced single limb stance exercises at sink with good return demo; Has met 2/4 goals, and anticipate good score on the Berg next session;  Pt made numerous mentions of his "stress in (his) head", and told me that he has been on meds for "stress" last year from a doctor in [place];   Took the time to emphasize the importance of keeping regular appointments with his primary care MD, and seeking perhaps Psychiatric care or outpatient Counseling; discussed this with Marliss Czar, and Almyra Free, OT, who is looking into any AA meetings locally in Spanish   Follow Up Recommendations  No PT follow up;Other (comment) (good progress with balance, and gait WNL including speed changes)     Equipment Recommendations  None recommended by PT    Recommendations for Other Services       Precautions / Restrictions Precautions Precautions: Fall Precaution Comments: Improving balance    Mobility  Bed Mobility Overal bed mobility: Modified Independent                Transfers Overall transfer level: Modified independent                  Ambulation/Gait Ambulation/Gait assistance: Independent Ambulation Distance (Feet): 400 Feet Assistive device: None Gait Pattern/deviations: WFL(Within Functional Limits)   Gait velocity interpretation: at or above normal speed for age/gender     Stairs Stairs: Yes Stairs assistance: Modified independent (Device/Increase time) Stair Management: No rails;Forwards;Alternating pattern Number of Stairs: 12 (x2; HR incr to 124 with stair negotioation) General stair comments: No difficulty  Wheelchair  Mobility    Modified Rankin (Stroke Patients Only)       Balance                 Single Leg Stance - Right Leg: 5 Single Leg Stance - Left Leg: 6                Cognition Arousal/Alertness: Awake/alert Behavior During Therapy: WFL for tasks assessed/performed Overall Cognitive Status: Within Functional Limits for tasks assessed                      Exercises Other Exercises Other Exercises: Single limb stance at sink; R and L; eyes open and eyes closed 3 reps each 10 seconds or greater    General Comments        Pertinent Vitals/Pain Pain Assessment: Faces Faces Pain Scale: Hurts a little bit Pain Location: HA Pain Descriptors / Indicators: Aching Pain Intervention(s): Monitored during session;Premedicated before session    Home Living                      Prior Function            PT Goals (current goals can now be found in the care plan section) Acute Rehab PT Goals Patient Stated Goal: be able to walk without feeling dizzy or weak Progress towards PT goals: Progressing toward goals (2/4 goals met)    Frequency  Min 3X/week    PT Plan Current plan remains appropriate    Co-evaluation  End of Session   Activity Tolerance: Patient tolerated treatment well Patient left: in bed;with call bell/phone within reach     Time: 0920-0948 PT Time Calculation (min) (ACUTE ONLY): 28 min  Charges:  $Gait Training: 8-22 mins $Therapeutic Exercise: 8-22 mins                    G Codes:      Roney Marion Hamff 07/27/2014, 11:06 AM  Roney Marion, [place] Pager [phone] Office [phone]

## 2014-07-27 NOTE — Discharge Summary (Signed)
Physician Discharge Summary  Max Lin ZOX:[id number] DOB: [birth year] DOA: 07/22/2014  PCP: No PCP Per Patient  Admit date: 07/22/2014 Discharge date: 07/27/2014  Time spent: 40 minutes  Recommendations for Outpatient Follow-up:  Hypovolemic shock 2/2 GI bleed/esophageal varices -5/7 Transfused 2 units PRBC -5/9 Transfused 2 units PRBC  -Stable; patient follow-up at [facility] in one week.  Hemorrhagic Shock 2/2 GI bleed/acute blood loss anemia, - 5/8 Varices post banding; stable  -Hemoglobin stable -Protonix 40 mg daily -Follow-up with Dr. Charlott Rakes (GI) in one month  Post banding pain -GI cocktail -Oxycodone 5 mg TID PRN  Alcoholic hepatitis with a discriminant function of 38.3.  -Prednisolone 40mg  po qd for 28 days followed by a taper over 2-3 weeks  Bibasilar atelectasis/infiltrates/left pleural effusion -Resolved   EtOH abuse, active/ -Counseled on absolute need to abstain from alcohol use    Back/hip pain -L-spine/hip x-ray; nondiagnostic    Discharge Diagnoses:  Active Problems:   Upper GI bleed   GI bleed   Esophageal varices with bleeding   Hypovolemic shock   Alcoholic hepatitis without ascites   Mild bibasilar atelectasis   Pleural effusion, left   Alcohol abuse   Notalgia   Hip pain   Hip pain, bilateral   Discharge Condition: Stable  Diet recommendation: Regular  Filed Weights   07/26/14 0300 07/27/14 0400 07/27/14 0520  Weight: 82.7 kg (182 lb 5.1 oz) 81 kg (178 lb 9.2 oz) 80.1 kg (176 lb 9.4 oz)    History of present illness:  34yo [name] male (primary language Spanish) PMHx substance abuse (alcoho drinks a substantial amount of beer, mostly Budweiser, cocaine); no further history available.  who presented to the [facility] ED with complaints of hematemesis. Per report he had about 1L at home and then more in the ED. He became hypotensive at OSH and received IVF. I was able to  briefly speak with him and he mentioned that he drank a substantial amount of beer, mostly Budweiser. He did not quantify how much. Per report he was also recently in the ED for alcohol detox.  During his hospitalization patient was treated for upper GI bleed consisting of esophageal varices. S/P (7) bands. Patient's hemoglobin has been stable post banding. Patient is having some discomfort gastric/esophageal secondary to the banding which is expected.    Consultants: Dr.Vincent Bosie Clos [facility]) Dr.Wesam Santa Genera [facility] M)   Procedure/Significant Events: -5/7 Transfused 2 units PRBC 5/8 abdominal ultrasound;Probable cholelithiasis. Fatty liver.  -5/8 Upper endoscopy; Large esophageal varices, 6 bands applied -5/9 Transfused 2 units PRBC  -5/9 PCXR; - Persistent bibasilar atelectasis and/or infiltrates; prominent Lt lung base. -Small Lt pleural effusion.    Discharge Exam: Filed Vitals:   07/27/14 0300 07/27/14 0400 07/27/14 0520 07/27/14 1146  BP:  136/90 134/88 110/58  Pulse:  62 65 82  Temp: 98 F (36.7 C) 98 F (36.7 C) 98.6 F (37 C)   TempSrc: Oral Oral Oral   Resp:  18 18   Height:   5\' 8"  (1.727 m)   Weight:  81 kg (178 lb 9.2 oz) 80.1 kg (176 lb 9.4 oz)   SpO2:  99% 100%     General: A/O 4, No acute respiratory distress Lungs: Clear to auscultation bilaterally without wheezes or crackles Cardiovascular: Regular rate and rhythm without murmur gallop or rub normal S1 and S2 Abdomen: Nontender, nondistended, soft, bowel sounds positive, no rebound, no ascites, no appreciable mass Extremities: No significant  cyanosis, clubbing, or edema bilateral lower extremities.    Discharge Instructions     Medication List    TAKE these medications        folic acid 1 MG tablet  Commonly known as:  FOLVITE  Take 1 tablet (1 mg total) by mouth daily.     gi cocktail Susp suspension  Take 30 mLs by mouth 3 (three) times daily as needed for indigestion (Post banding  pain). Shake well.     oxyCODONE 5 MG immediate release tablet  Commonly known as:  Oxy IR/ROXICODONE  Take 1 tablet (5 mg total) by mouth every 8 (eight) hours as needed for severe pain.     pantoprazole 40 MG tablet  Commonly known as:  PROTONIX  Take 1 tablet (40 mg total) by mouth daily at 12 noon.     prednisoLONE 15 MG/5ML Soln  Commonly known as:  PRELONE  Take 13.3 mLs (40 mg total) by mouth daily before breakfast.     thiamine 100 MG tablet  Take 1 tablet (100 mg total) by mouth daily.       No Known Allergies Follow-up Information    Follow up with [facility]    . Schedule an appointment as soon as possible for a visit in 1 week.   Why:  Follow-up establish care GI bleed secondary to gastric varices secondary to alcohol abuse, cocaine abuse,   Contact information:   [address] [place] [address] [phone]      Follow up with Shirley Friar., MD. Schedule an appointment as soon as possible for a visit in 1 month.   Specialty:  Gastroenterology   Why:  Follow-up hospitalization GI bleed secondary to gastric varices secondary to alcohol abuse,Pcp cocaine use   Contact information:   [address] [place] [address] [phone]       Follow up with [facility]    .   Why:  Appointment on May 18th @ 11;30 please arrive @ 11;15amPlease schedule appt. with Dr. Bosie Clos  has to be PCP refer and make appointment   Contact information:   [address] [place] [address] [phone]       The results of significant diagnostics from this hospitalization (including imaging, microbiology, ancillary and laboratory) are listed below for reference.    Significant Diagnostic Studies: Dg Lumbar Spine 2-3 Views  07/25/2014   CLINICAL DATA:  Low back pain for 1 day without trauma.  EXAM: LUMBAR SPINE - 2-3 VIEW  COMPARISON:  None.  FINDINGS:  Five lumbar type vertebral bodies. Sacroiliac joints are symmetric. Maintenance of vertebral body height. Mild nonspecific straightening of expected lordosis. Intervertebral disc heights are maintained.  IMPRESSION: No acute osseous abnormality.   Electronically Signed   By: Jeronimo Greaves M.D.   On: 07/25/2014 20:10   Ct Head Wo Contrast  07/19/2014   CLINICAL DATA:  Dizziness  EXAM: CT HEAD WITHOUT CONTRAST  TECHNIQUE: Contiguous axial images were obtained from the base of the skull through the vertex without intravenous contrast.  COMPARISON:  None.  FINDINGS: No skull fracture is noted. Paranasal sinuses and mastoid air cells are unremarkable.  No intracranial hemorrhage, mass effect or midline shift. No hydrocephalus. No acute infarction. No mass lesion is noted on this unenhanced scan. The gray and white-matter differentiation is preserved. No intra or extra-axial fluid collection.  IMPRESSION: No acute intracranial abnormality.   Electronically  Signed   By: Natasha Mead M.D.   On: 07/19/2014 09:17   US Abdomen Complete  07/24/2014   CLINICAL DATA:  New right lower quadrant abdominal pain.  Back pain.  EXAM: ULTRASOUND ABDOMEN COMPLETE  COMPARISON:  None.  FINDINGS: Gallbladder: There probably are multiple small calculi within the gallbladder lumen measuring up to 4 mm. There is no gallbladder wall thickening or pericholecystic fluid. The patient was not tender over the gallbladder.  Common bile duct: Diameter: 6 mm  Liver: There is slightly increased echogenicity of hepatic parenchyma consistent with fatty infiltration. There is no focal liver lesion. There is no intrahepatic bile duct dilatation.  IVC: No abnormality visualized.  Pancreas: Not visible.  Spleen: Upper limits normal in size.  Right Kidney: Length: 11.8 cm. Echogenicity within normal limits. No mass or hydronephrosis visualized.  Left Kidney: Length: 13.1 cm. Echogenicity within normal limits. No mass or hydronephrosis visualized.  Abdominal  aorta: Proximal and midportions are normal in size. The distal portion was not visible.  Other findings: None.  IMPRESSION: Probable cholelithiasis. Fatty liver. Nondiagnostic views of the pancreas and distal aorta. Upper normal splenic size.   Electronically Signed   By: Ellery Plunk M.D.   On: 07/24/2014 01:45   Dg Chest Port 1 View  07/24/2014   CLINICAL DATA:  Respiratory failure.  EXAM: PORTABLE CHEST - 1 VIEW  COMPARISON:  07/23/2014 .  FINDINGS: Endotracheal tube has been removed. Right IJ line in stable position. Persistent atelectasis and or infiltrates in the lung bases, particularly left lung base. Small left pleural effusion. No pneumothorax. Stable cardiomegaly .  IMPRESSION: 1. Interim extubation.  Right IJ line in stable position. 2. Persistent bibasilar atelectasis and/or infiltrates, particularly prominent in the left lung base. Small left pleural effusion.   Electronically Signed   By: Maisie Fus  Register   On: 07/24/2014 07:06   Dg Chest Port 1 View  07/23/2014   CLINICAL DATA:  Reposition endotracheal tube  EXAM: PORTABLE CHEST - 1 VIEW  COMPARISON:  07/23/2014 at 00:42  FINDINGS: The endotracheal tube has been pulled back, and is now 4.6 cm above the carina. The right jugular central line extends into the right atrium. There is no pneumothorax. There is mild basilar opacity, left greater than right, unchanged.  IMPRESSION: Endotracheal tube has been repositioned as detailed above. No significant interval change in the bilateral airspace opacities.   Electronically Signed   By: Ellery Plunk M.D.   On: 07/23/2014 02:08   Dg Chest Port 1 View  07/23/2014   CLINICAL DATA:  Central line placement  EXAM: PORTABLE CHEST - 1 VIEW  COMPARISON:  None.  FINDINGS: Endotracheal tube tip is 2 cm above the carina. There is a right jugular central line extending into the right atrium. There is no pneumothorax. There is left base consolidation. The right lung is clear.  IMPRESSION: Support equipment  appears satisfactorily positioned.  No pneumothorax.  Left base consolidation   Electronically Signed   By: Ellery Plunk M.D.   On: 07/23/2014 01:36   Dg Chest Portable 1 View  07/22/2014   CLINICAL DATA:  Central line placement, hematemesis, hypotension  EXAM: PORTABLE CHEST - 1 VIEW  COMPARISON:  Portable exam 2211 hr compared to 1903 hr  FINDINGS: RIGHT jugular central venous catheter with tip projecting low over RIGHT atrium, recommend withdrawal 8 cm.  Enlargement of cardiac silhouette.  Mediastinal contours and pulmonary vascularity normal.  Lungs clear.  No pleural effusion or pneumothorax.  IMPRESSION:  RIGHT jugular central venous catheter projects low over RIGHT atrium, recommend withdrawal 8 cm.  [facility] (accepting patient on transfer) on 07/22/2014 at 2049 hr.   Electronically Signed   By: Ulyses SouthwardMark  Boles M.D.   On: 07/22/2014 22:49   Dg Chest Port 1 View  07/22/2014   CLINICAL DATA:  Hemoptysis, hypotension, vomited a large amount of dark blood at home, weakness, smoker, appears jaundiced  EXAM: PORTABLE CHEST - 1 VIEW  COMPARISON:  Portable exam 1903 hr without priors for comparison  FINDINGS: Borderline enlargement of cardiac silhouette for technique.  Mediastinal contours and pulmonary vascularity normal.  Subsegmental atelectasis RIGHT base.  Lungs otherwise clear.  No pleural effusion or pneumothorax.  IMPRESSION: RIGHT basilar subsegmental atelectasis.   Electronically Signed   By: Ulyses SouthwardMark  Boles M.D.   On: 07/22/2014 19:53   Dg Abd Portable 1v  07/24/2014   CLINICAL DATA:  Upper abdominal pain  EXAM: PORTABLE ABDOMEN - 1 VIEW  COMPARISON:  None.  FINDINGS: Air is seen throughout the small and large bowel. There is no appreciable bowel obstruction. No air-fluid levels. No free air is seen on this supine examination. There is some slight wall thickening in multiple loops of small bowel. This appearance raises question of a degree of enteritis. There is a tiny  phlebolith in mid right pelvis.  IMPRESSION: Suspect a degree of enteritis. No bowel obstruction or free air appreciable on this supine examination.   Electronically Signed   By: Bretta BangWilliam  Woodruff III M.D.   On: 07/24/2014 07:58    Microbiology: Recent Results (from the past 240 hour(s))  MRSA PCR Screening     Status: None   Collection Time: 07/22/14 11:46 PM  Result Value Ref Range Status   MRSA by PCR NEGATIVE NEGATIVE Final    Comment:        The GeneXpert MRSA Assay (FDA approved for NASAL specimens only), is one component of a comprehensive MRSA colonization surveillance program. It is not intended to diagnose MRSA infection nor to guide or monitor treatment for MRSA infections.      Labs: Basic Metabolic Panel:  Recent Labs Lab 07/22/14 1921 07/22/14 1929 07/23/14 0321 07/24/14 0415 07/25/14 0315 07/27/14 0420  NA 136 137 136 136 134* 136  K 3.7 3.7 5.0 3.3* 3.3* 3.4*  CL 106 102 111 104 103 103  CO2 23  --  20* 24 27 25   GLUCOSE 144* 141* 161* 101* 81 87  BUN 17 17 15 10 7  <5*  CREATININE 0.89 1.00 0.78 0.62 0.62 0.64  CALCIUM 7.7*  --  6.6* 7.4* 7.8* 7.9*  MG  --   --  1.4* 1.5* 1.6* 1.9  PHOS  --   --  4.4 2.0* 2.9  --    Liver Function Tests:  Recent Labs Lab 07/22/14 1921 07/23/14 0321 07/27/14 0420  AST 103* 76* 98*  ALT 41 32 61  ALKPHOS 109 80 87  BILITOT 1.6* 2.1* 1.6*  PROT 6.7 5.4* 6.5  ALBUMIN 2.7* 2.3* 2.5*    Recent Labs Lab 07/24/14 0530  LIPASE 33  AMYLASE 74   No results for input(s): AMMONIA in the last 168 hours. CBC:  Recent Labs Lab 07/22/14 1921  07/23/14 0437 07/23/14 1400 07/24/14 0415 07/25/14 0315 07/26/14 0915 07/27/14 0421  WBC 12.6*  --  16.7* 21.0* 8.9 7.6 7.7 6.7  NEUTROABS 9.1*  --  13.0*  --   --   --   --   --  HGB 8.7*  < > 8.0* 7.1* 5.9* 8.1* 9.1* 8.9*  HCT 27.0*  < > 24.5* 21.4* 18.0* 24.0* 26.8* 26.8*  MCV 74.2*  --  75.6* 74.8* 75.9* 76.4* 77.5* 77.0*  PLT 46*  --  72* 76* 50* 71* 106*  103*  < > = values in this interval not displayed. Cardiac Enzymes: No results for input(s): CKTOTAL, CKMB, CKMBINDEX, TROPONINI in the last 168 hours. BNP: BNP (last 3 results) No results for input(s): BNP in the last 8760 hours.  ProBNP (last 3 results) No results for input(s): PROBNP in the last 8760 hours.  CBG: No results for input(s): GLUCAP in the last 168 hours.     Signed:  Carolyne Littlesurtis Temara Lanum, MD [facility] [phone] pager

## 2014-07-27 NOTE — Progress Notes (Signed)
Report called to [facility] room 22 RN. Patient prepared for transfer and will go via wheelchair. All belongings sent with patient. Patient alert and oriented, CIWA protocal remains zero. Monitoring closely.

## 2014-07-27 NOTE — Care Management Note (Signed)
Case Management Note  Patient Details  Name: Max FettersJuan Torres Lin MRN: [id number] Date of Birth: April 22, 1980  Subjective/Objective:        Pt adm on 07/22/14 with upper GI bleed, secondary to ETOH abuse.  PTA, pt lives at home with three friends.              Action/Plan: Pt for dc home today.  [facility] for 5/18; appt info on AVS.  Pt states he needs help with medications.  (via interpreter).  He is eligible for medication assistance through [facility].  [facility] explanation of [facility] benefits.  CSW counseled on ETOH cessation prior to dc.    Expected Discharge Date:                  Expected Discharge Plan:  Home/Self Care  In-House Referral:  Clinical Social Work  Discharge planning Services  CM Consult, Medication Assistance, MATCH Program, Indigent [facility] Clinic  Post Acute Care Choice:    Choice offered to:     DME Arranged:    DME Agency:     HH Arranged:    HH Agency:     Status of Service:  Completed, signed off  Medicare Important Message Given:  No Date Medicare IM Given:    Medicare IM give by:    Date Additional Medicare IM Given:    Additional Medicare Important Message give by:     If discussed at Long Length of Stay Meetings, dates discussed:    Additional Comments:  Glennon Macmerson, Jennelle Pinkstaff M, RN 07/27/2014, 4:48 PM [phone]

## 2014-07-27 NOTE — Progress Notes (Signed)
DC IV, DC Tele, DC Home. Discharge instructions and home medications discussed with patient using translator. Patient denied any questions or concerns at this time. Patient leaving unit via wheelchair and appears in no acute distress.

## 2014-07-27 NOTE — Evaluation (Signed)
Occupational Therapy Evaluation Patient Details Name: Max FettersJuan Torres Lin MRN: [id number] DOB: 1980-09-21 Today's Date: 07/27/2014    History of Present Illness 33yo [name] who [facility] with complaints of hematemesis after drinking beer   Clinical Impression   Pt demonstrated ability to perform toileting, upper body and lower body dressing.  He has been routinely performing bathing here in the hospital.  Offered pt information on Alcoholics Anonymous printed in Spanish.  Pt appreciative of information. No further OT needs.    Follow Up Recommendations  No OT follow up    Equipment Recommendations       Recommendations for Other Services       Precautions / Restrictions Precautions Precautions: Fall Precaution Comments: Improving balance      Mobility Bed Mobility Overal bed mobility: Modified Independent                Transfers Overall transfer level: Modified independent                    Balance                 Single Leg Stance - Right Leg: 5 Single Leg Stance - Left Leg: 6             Standardized Balance Assessment Standardized Balance Assessment : Dynamic Gait Index   Dynamic Gait Index Level Surface: Normal Change in Gait Speed: Normal Gait with Horizontal Head Turns: Mild Impairment Gait with Vertical Head Turns: Normal Gait and Pivot Turn: Normal Step Over Obstacle: Normal Step Around Obstacles: Normal Steps: Normal Total Score: 23      ADL Overall ADL's : Modified independent;Independent                                             Vision     Perception     Praxis      Pertinent Vitals/Pain Pain Assessment: Faces Faces Pain Scale: Hurts a little bit Pain Location: HA Pain Descriptors / Indicators: Grimacing Pain Intervention(s): Monitored during session     Hand Dominance Right   Extremity/Trunk Assessment Upper Extremity Assessment Upper Extremity  Assessment: Overall WFL for tasks assessed   Lower Extremity Assessment Lower Extremity Assessment: Overall WFL for tasks assessed   Cervical / Trunk Assessment Cervical / Trunk Assessment: Normal   Communication Communication Communication: Prefers language other than English   Cognition Arousal/Alertness: Awake/alert Behavior During Therapy: WFL for tasks assessed/performed Overall Cognitive Status: Within Functional Limits for tasks assessed                     General Comments       Exercises Exercises: Other exercises Other Exercises Other Exercises: Single limb stance at sink; R and L; eyes open and eyes closed 3 reps each 10 seconds or greater   Shoulder Instructions      Home Living Family/patient expects to be discharged to:: Private residence Living Arrangements: Non-relatives/Friends Available Help at Discharge: Friend(s);Available PRN/intermittently Type of Home: House Home Access: Stairs to enter Enter[employer] CorporationEntrance Stairs-Number of Steps: 2   Home Layout: One level     Bathroom Shower/Tub: Chief Strategy OfficerTub/shower unit   Bathroom Toilet: Standard     Home Equipment: None          Prior Functioning/Environment Level of Independence: Independent  Comments: works as a Archivistpainter    OT Diagnosis:     OT Problem List:     OT Treatment/Interventions:      OT Goals(Current goals can be found in the care plan section) Acute Rehab OT Goals Patient Stated Goal: be able to walk without feeling dizzy or weak  OT Frequency:     Barriers to D/C:            Co-evaluation              End of Session    Activity Tolerance: Patient tolerated treatment well Patient left: in bed;with call bell/phone within reach   Time: 1115-1130 OT Time Calculation (min): 15 min Charges:  OT General Charges $OT Visit: 1 Procedure OT Evaluation $Initial OT Evaluation Tier I: 1 Procedure G-Codes:    Evern BioMayberry, Migel Hannis Lynn 07/27/2014, 11:34 AM  [phone]

## 2014-08-02 ENCOUNTER — Encounter: Payer: Self-pay | Admitting: Family Medicine

## 2014-08-02 ENCOUNTER — Ambulatory Visit: Payer: Self-pay | Attending: Family Medicine | Admitting: Family Medicine

## 2014-08-02 VITALS — BP 124/74 | HR 78 | Temp 98.1°F | Resp 18 | Ht 66.0 in | Wt 183.0 lb

## 2014-08-02 DIAGNOSIS — D500 Iron deficiency anemia secondary to blood loss (chronic): Secondary | ICD-10-CM

## 2014-08-02 DIAGNOSIS — K922 Gastrointestinal hemorrhage, unspecified: Secondary | ICD-10-CM

## 2014-08-02 DIAGNOSIS — D509 Iron deficiency anemia, unspecified: Secondary | ICD-10-CM | POA: Insufficient documentation

## 2014-08-02 DIAGNOSIS — I8501 Esophageal varices with bleeding: Secondary | ICD-10-CM

## 2014-08-02 DIAGNOSIS — F418 Other specified anxiety disorders: Secondary | ICD-10-CM | POA: Insufficient documentation

## 2014-08-02 DIAGNOSIS — F1010 Alcohol abuse, uncomplicated: Secondary | ICD-10-CM

## 2014-08-02 DIAGNOSIS — R251 Tremor, unspecified: Secondary | ICD-10-CM

## 2014-08-02 LAB — CBC WITH DIFFERENTIAL/PLATELET
BASOS ABS: 0 10*3/uL (ref 0.0–0.1)
Basophils Relative: 0 % (ref 0–1)
EOS PCT: 0 % (ref 0–5)
Eosinophils Absolute: 0 10*3/uL (ref 0.0–0.7)
HEMATOCRIT: 32.8 % — AB (ref 39.0–52.0)
HEMOGLOBIN: 10.5 g/dL — AB (ref 13.0–17.0)
Lymphocytes Relative: 6 % — ABNORMAL LOW (ref 12–46)
Lymphs Abs: 0.8 10*3/uL (ref 0.7–4.0)
MCH: 24.6 pg — ABNORMAL LOW (ref 26.0–34.0)
MCHC: 32 g/dL (ref 30.0–36.0)
MCV: 77 fL — AB (ref 78.0–100.0)
MONOS PCT: 3 % (ref 3–12)
MPV: 10.6 fL (ref 8.6–12.4)
Monocytes Absolute: 0.4 10*3/uL (ref 0.1–1.0)
Neutro Abs: 11.7 10*3/uL — ABNORMAL HIGH (ref 1.7–7.7)
Neutrophils Relative %: 91 % — ABNORMAL HIGH (ref 43–77)
Platelets: 285 10*3/uL (ref 150–400)
RBC: 4.26 MIL/uL (ref 4.22–5.81)
RDW: 19.6 % — AB (ref 11.5–15.5)
WBC: 12.9 10*3/uL — ABNORMAL HIGH (ref 4.0–10.5)

## 2014-08-02 LAB — COMPREHENSIVE METABOLIC PANEL
ALBUMIN: 3.4 g/dL — AB (ref 3.5–5.2)
ALT: 95 U/L — AB (ref 0–53)
AST: 110 U/L — AB (ref 0–37)
Alkaline Phosphatase: 150 U/L — ABNORMAL HIGH (ref 39–117)
BUN: 7 mg/dL (ref 6–23)
CO2: 26 mEq/L (ref 19–32)
Calcium: 8.4 mg/dL (ref 8.4–10.5)
Chloride: 100 mEq/L (ref 96–112)
Creat: 0.64 mg/dL (ref 0.50–1.35)
Glucose, Bld: 246 mg/dL — ABNORMAL HIGH (ref 70–99)
POTASSIUM: 4 meq/L (ref 3.5–5.3)
SODIUM: 134 meq/L — AB (ref 135–145)
Total Bilirubin: 1.2 mg/dL (ref 0.2–1.2)
Total Protein: 7.2 g/dL (ref 6.0–8.3)

## 2014-08-02 MED ORDER — FLUOXETINE HCL 40 MG PO CAPS: 40.0000 mg | ORAL_CAPSULE | Freq: Every day | ORAL | Status: AC

## 2014-08-02 MED ORDER — LACTULOSE 10 GM/15ML PO SOLN: 10.0000 g | Freq: Three times a day (TID) | ORAL | Status: DC

## 2014-08-02 NOTE — Progress Notes (Signed)
Patient in hospital because of alters in stomach due to alcohol. Patient questions kidney function. Patient complains of mid chest pain with eating. Patient can not focus, "my nerves are getting on top of me". Patient gets nervous when a lot of people are around or when working. Patient had panic attack in past, diagnosed with depression. Had medication for depression, does not have them anymore. Patient having problems sleeping. Patient reports body is here but mind is not in present, sometimes thinks about future or other things. Patient currently has headache, at level 5.

## 2014-08-02 NOTE — Progress Notes (Signed)
ASSESSMENT: Pt currently experiencing symptoms of depression and anxiety. He needs to take medications as prescribed, and [facility]. He would benefit from psychoeducation and supportive counseling regarding coping with symptoms of anxiety and depression.  Stage of Change: contemplative  PLAN: 1. F/U with behavioral health consultant in one  week 2. Psychiatric Medications: Prozac, take as prescribed. 3. Behavioral recommendation(s):   -Consider making a plan to become more active at next visit -Consider going on one walk outside this coming week   SUBJECTIVE: Pt. referred by Dr Venetia NightAmao for symptoms of anxiety and depression:  Pt. here for referral regarding coping with symptoms of anxiety and depression.  Pt. reports the following symptoms/concerns: Pt states that has felt depressed for about 18 months, and that there has been no event that triggered the depressed mood. Pt states that he thinks he might have been feeling depressed for longer than that time, but that it has felt worse in the past 1.5 years. He often drinks alcohol to cope with low mood. Pt says that when he was more physically active, he felt a lot better, and that he would consider being more active again.  Duration of problem: 1.5 years Severity: moderate  OBJECTIVE: Orientation & Cognition: Oriented x3. Thought processes normal and appropriate to situation. Mood: low. Affect: appropriate Appearance: appropriate Risk of harm to self or others: no risk of harm to self or others Substance use: alcohol Psychiatric medication use: Unchanged from prior contact. Assessments administered: PSQ9-23/ GAD7-23  Diagnosis: Depression with anxiety CPT Code: F41.8 -------------------------------------------- Other(s) present in the room: interpretor, Celene SkeenBelen Watkins  Time spent with patient in exam room: 25 minutes

## 2014-08-02 NOTE — Progress Notes (Signed)
Subjective:    Patient ID: Max Lin, male    DOB: 21-Aug-1980, 34 y.o.   MRN: [id number]  HPI  Admit date: 07/22/14 Discharge date: 07/27/14  He had presented to the ED at [facility] with hematemesis and had admitted to drinking a substantial amount of beer. He also had recent ED visit for alcohol detox prior to this presentation. On presentation he was found to be anemic with a hemoglobin of 8.7 which later dropped to 5.9 ,hypotensive with a diagnosis of hemorrhagic shock, and thrombocytopenic. He was intubated for acute respiratory failure and the need for airway protection,commenced on IV proton pump inhibitor and octreotide as well as prednisone. He received 2 units of PRBC and GI was consulted; he had an EGD with banding of esophageal varices x6.  Abdominal ultrasound revealed probable cholelithiasis, fatty liver; checks x-ray revealed bibasal atelectasis and/or infiltrates, small left pleural effusion He was placed on CIWA protocol for alcohol abuse and received supplementation for hypokalemia, hypomagnesemia, and hypophosphatemia. His condition improved and he was placed on a prednisone taper, thiamine, folic acid and pantoprazole prior to discharge.  Interval History: Complains of mid chest pain which is worse when he eats. His last drink was 15 days ago; wakes up feeling weak, complains of insomnia and and feeling jittery, on the edge and nervous. He tells me he was previously on an anxiolytic while in [place] which he was told to take as needed because it was habit forming and he is unable to recall the name of the medication. He endorses some hematochezia when he strains to move his bowel.  Past Medical History  Diagnosis Date  . Anxiety   . Depression   . GERD (gastroesophageal reflux disease)     Past Surgical History  Procedure Laterality Date  . Esophagogastroduodenoscopy N/A 07/22/2014    Procedure: ESOPHAGOGASTRODUODENOSCOPY (EGD);  Surgeon: Bernette Redbirdobert  Buccini, MD;  [facility];  Service: Endoscopy;  Laterality: N/A;    History   Social History  . Marital Status: Single    Spouse Name: N/A  . Number of Children: N/A  . Years of Education: N/A   Occupational History  . Not on file.   Social History Main Topics  . Smoking status: Current Some Day Smoker    Types: Cigarettes  . Smokeless tobacco: Not on file  . Alcohol Use: Yes     Comment: last alcohol use 1 week ago  . Drug Use: Yes    Special: Cocaine     Comment: Cocaine last used 15 days ago   . Sexual Activity: Not on file   Other Topics Concern  . Not on file   Social History Narrative    No Known Allergies  Current Outpatient Prescriptions on File Prior to Visit  Medication Sig Dispense Refill  . Alum & Mag Hydroxide-Simeth (GI COCKTAIL) SUSP suspension Take 30 mLs by mouth 3 (three) times daily as needed for indigestion (Post banding pain). Shake well. 360 mL 0  . folic acid (FOLVITE) 1 MG tablet Take 1 tablet (1 mg total) by mouth daily. 30 tablet 0  . pantoprazole (PROTONIX) 40 MG tablet Take 1 tablet (40 mg total) by mouth daily at 12 noon. 30 tablet 0  . prednisoLONE (PRELONE) 15 MG/5ML SOLN Take 13.3 mLs (40 mg total) by mouth daily before breakfast. 450 mL 0  . thiamine 100 MG tablet Take 1 tablet (100 mg total) by mouth daily. 30 tablet 0   No current facility-administered medications  on file prior to visit.     Review of Systems  Constitutional: Negative for activity change and appetite change.  HENT: Negative for sinus pressure and sore throat.   Eyes: Negative for visual disturbance.  Respiratory: Negative for chest tightness and shortness of breath.   Cardiovascular: Positive for chest pain. Negative for palpitations.  Gastrointestinal: Positive for abdominal pain. Negative for abdominal distention.  Endocrine: Negative for cold intolerance, heat intolerance and polyphagia.  Genitourinary: Negative for dysuria, frequency and  difficulty urinating.  Musculoskeletal: Negative for back pain, joint swelling and arthralgias.  Skin: Negative for color change.  Neurological: Negative for dizziness, tremors and weakness.  Psychiatric/Behavioral: Positive for sleep disturbance. Negative for suicidal ideas and behavioral problems. The patient is nervous/anxious.          Objective: Filed Vitals:   08/02/14 1142  BP: 124/74  Pulse: 78  Temp: 98.1 F (36.7 C)  TempSrc: Oral  Resp: 18  Height: 5\' 6"  (1.676 m)  Weight: 183 lb (83.008 kg)  SpO2: 99%      Physical Exam  Constitutional: He is oriented to person, place, and time. He appears well-developed and well-nourished.  HENT:  Head: Normocephalic and atraumatic.  Right Ear: External ear normal.  Left Ear: External ear normal.  Eyes: Conjunctivae and EOM are normal. Pupils are equal, round, and reactive to light.  Neck: Normal range of motion. Neck supple. No tracheal deviation present.  Cardiovascular: Normal rate, regular rhythm and normal heart sounds.   No murmur heard. Pulmonary/Chest: Effort normal and breath sounds normal. No respiratory distress. He has no wheezes. He exhibits no tenderness.  Abdominal: Soft. Bowel sounds are normal. He exhibits no mass. There is no tenderness.  Musculoskeletal: Normal range of motion. He exhibits no edema or tenderness.  Neurological: He is alert and oriented to person, place, and time.  Slight tremors  Skin: Skin is warm and dry.  Psychiatric:  Anxious             Assessment & Plan:  34 year old male with recent hospitalization for upper GI bleed secondary to alcohol abuse status post banding 6 of esophageal varices presently with residual symptoms of delirium tremens also with anxiety and depressive symptoms.  Alcoholic hepatitis: Prednisolone 40mg  po qd for 28 days followed by a taper over 2-3 weeks  Esophageal varices; The esophageal banding could explain the pain he has. Continue GI  cocktail. Advised to keep appointment with GI.  Depression with anxiety PHQ9 score: 23, GAD7 score 21 Placed on SSRI Behavioral health to see patient to discuss psychotherapy.  Alcohol abuse/tremors Currently exhibiting mild symptoms of delirium tremens. Advised to continue current medications and to notify the clinic in the event the symptoms worsen. Advised to continue to abstain from alcohol.  Anemia,: Repeat CBC  Disclaimer: This note was dictated with voice recognition software. Similar sounding words can inadvertently be transcribed and this note may contain transcription errors which may not have been corrected upon publication of note.

## 2014-08-02 NOTE — Patient Instructions (Signed)
Intoxicación alcohólica °(Alcohol Intoxication) °La intoxicación alcohólica se produce cuando la cantidad de alcohol que se ha consumido daña la capacidad de funcionamiento mental y físico. El alcohol deteriora directamente la actividad química normal del cerebro. Beber grandes cantidades de alcohol puede conducir a cambios en el funcionamiento mental y en el comportamiento, y puede causar muchos efectos físicos que pueden ser perjudiciales.  °La intoxicación alcohólica puede variar en gravedad desde leve hasta muy grave. Hay varios factores que pueden afectar el nivel de intoxicación que se produce, como la edad de la persona, el sexo, el peso, la frecuencia de consumo de alcohol, y la presencia de otras enfermedades médicas (como diabetes, convulsiones o enfermedades del corazón). Los niveles peligrosos de intoxicación por alcohol pueden ocurrir cuando las personas beben grandes cantidades de alcohol en un corto periodo de tiempo (emborracharse). El alcohol también puede ser especialmente peligroso cuando se combina con ciertos medicamentos recetados o drogas "recreativas". °SIGNOS Y SÍNTOMAS °Algunos de los signos y síntomas comunes de intoxicación leve por alcohol incluyen: °· Pérdida de la coordinación. °· Cambios en el estado de ánimo y la conducta. °· Incapacidad para razonar. °· Hablar arrastrando las palabras. °A medida que la intoxicación por alcohol avanza a niveles más graves, van a aparecer otros signos y síntomas. Estos pueden ser: °· Vómitos. °· Confusión y alteración de la memoria. °· Disminución de la frecuencia respiratoria. °· Convulsiones. °· Pérdida de la conciencia. °DIAGNÓSTICO  °El médico le hará una historia clínica y un examen físico. Se le preguntará acerca de la cantidad y el tipo de alcohol que ha consumido. Se le realizarán análisis de sangre para medir la concentración de alcohol en sangre. En muchos lugares, el nivel de alcohol en la sangre debe ser inferior a 80 mg / dL (0,08%) para  poder conducir legalmente. Sin embargo, hay muchos efectos peligrosos del alcohol que pueden ocurrir con niveles mucho más bajos.  °TRATAMIENTO  °Las personas con intoxicación por alcohol a menudo no requieren tratamiento. La mayor parte de los efectos de la intoxicación por alcohol son temporales, y desaparecen a medida que el alcohol abandona el cuerpo de forma natural. El profesional controlará su estado hasta que esté lo suficientemente estable como para volver a casa. A veces se administran líquidos por vía intravenosa para ayudar a evitar la deshidratación.  °INSTRUCCIONES PARA EL CUIDADO EN EL HOGAR °· No conduzca vehículos después de beber alcohol. °· Manténgase hidratado. Beba gran cantidad de líquido para mantener la orina de tono claro o color amarillo pálido. Evite la cafeína.   °· Tome sólo medicamentos de venta libre o recetados, según las indicaciones del médico.   °SOLICITE ATENCIÓN MÉDICA SI:  °· Tiene vómitos persistentes.   °· No mejora luego de algunos días. °· Se intoxica con alcohol con frecuencia. El médico podrá ayudarlo a decidir si debe consultar a un terapeuta especializado en el abuso de sustancias. °SOLICITE ATENCIÓN MÉDICA DE INMEDIATO SI:  °· Se siente vacilante o tembloroso cuando trata de abandonar el hábito.   °· Comienza a temblar de manera incontrolable (convulsiones).   °· Vomita sangre. Puede ser sangre de color rojo brillante o similar al sedimento del café negro.   °· Observa sangre en la materia fecal. Puede ser de color rojo brillante o de aspecto alquitranado, con olor fétido.   °· Se siente mareado o se desmaya.   °ASEGÚRESE DE QUE:  °· Comprende estas instrucciones. °· Controlará su afección. °· Recibirá ayuda de inmediato si no mejora o si empeora. °Document Released: 03/03/2005 Document Revised: 11/03/2012 °ExitCare® Patient Information ©  2015 [employer]. This information is not intended to replace advice given to you by your health care provider. Make sure you  discuss any questions you have with your health care provider.

## 2014-08-08 ENCOUNTER — Telehealth: Payer: Self-pay

## 2014-08-08 NOTE — Telephone Encounter (Signed)
Nurse called patient via [employer] Du[employer]pret[employer], Max Lin. Reached voicemail, could not leave message due to phone disconnected.

## 2014-08-08 NOTE — Telephone Encounter (Signed)
-----   Message from Enobong Amao, MD sent at 08/03/2014  4:55 PM EDT ----- Please inform him that his labs reveal anemia has improved slightly; he has elevated liver enzymes which are trending up and are as a result of alcoholic hepatitis. He will have a repeat at his next office visit. 

## 2014-08-16 ENCOUNTER — Encounter: Payer: Self-pay | Admitting: Family Medicine

## 2014-08-16 ENCOUNTER — Ambulatory Visit: Payer: Self-pay | Admitting: Clinical

## 2014-08-16 ENCOUNTER — Ambulatory Visit: Payer: Self-pay | Attending: Family Medicine | Admitting: Family Medicine

## 2014-08-16 VITALS — BP 104/66 | HR 82 | Temp 98.5°F | Resp 16 | Ht 65.0 in | Wt 178.0 lb

## 2014-08-16 DIAGNOSIS — F1020 Alcohol dependence, uncomplicated: Secondary | ICD-10-CM

## 2014-08-16 DIAGNOSIS — K922 Gastrointestinal hemorrhage, unspecified: Secondary | ICD-10-CM

## 2014-08-16 DIAGNOSIS — J309 Allergic rhinitis, unspecified: Secondary | ICD-10-CM | POA: Insufficient documentation

## 2014-08-16 DIAGNOSIS — F1410 Cocaine abuse, uncomplicated: Secondary | ICD-10-CM | POA: Insufficient documentation

## 2014-08-16 DIAGNOSIS — F418 Other specified anxiety disorders: Secondary | ICD-10-CM

## 2014-08-16 DIAGNOSIS — R21 Rash and other nonspecific skin eruption: Secondary | ICD-10-CM | POA: Insufficient documentation

## 2014-08-16 DIAGNOSIS — D509 Iron deficiency anemia, unspecified: Secondary | ICD-10-CM

## 2014-08-16 DIAGNOSIS — F419 Anxiety disorder, unspecified: Secondary | ICD-10-CM | POA: Insufficient documentation

## 2014-08-16 DIAGNOSIS — I8501 Esophageal varices with bleeding: Secondary | ICD-10-CM

## 2014-08-16 DIAGNOSIS — F1010 Alcohol abuse, uncomplicated: Secondary | ICD-10-CM

## 2014-08-16 DIAGNOSIS — I8500 Esophageal varices without bleeding: Secondary | ICD-10-CM | POA: Insufficient documentation

## 2014-08-16 DIAGNOSIS — R739 Hyperglycemia, unspecified: Secondary | ICD-10-CM

## 2014-08-16 LAB — GLUCOSE, POCT (MANUAL RESULT ENTRY): POC Glucose: 109 mg/dl — AB (ref 70–99)

## 2014-08-16 LAB — POCT GLYCOSYLATED HEMOGLOBIN (HGB A1C): Hemoglobin A1C: 5.2

## 2014-08-16 MED ORDER — PANTOPRAZOLE SODIUM 40 MG PO TBEC: 40.0000 mg | DELAYED_RELEASE_TABLET | Freq: Every day | ORAL | Status: AC

## 2014-08-16 MED ORDER — VITAMIN B-1 100 MG PO TABS: 100.0000 mg | ORAL_TABLET | Freq: Every day | ORAL | Status: AC

## 2014-08-16 MED ORDER — FOLIC ACID 1 MG PO TABS: 1.0000 mg | ORAL_TABLET | Freq: Every day | ORAL | Status: AC

## 2014-08-16 MED ORDER — DIPHENHYDRAMINE HCL 25 MG PO TABS: 25.0000 mg | ORAL_TABLET | Freq: Every evening | ORAL | Status: AC | PRN

## 2014-08-16 NOTE — Assessment & Plan Note (Signed)
A: swollen nares consistent with rhinitis P: nightly benadryl

## 2014-08-16 NOTE — Assessment & Plan Note (Addendum)
2. Anemia: checking CBC today Iron ordered

## 2014-08-16 NOTE — Assessment & Plan Note (Signed)
A:Substance induced mood disorder vs primary mood disorder exacerbated by substance abuse P:  Continue prozac Add benadryl at night for waking up and swelling in nose Take thiamine and folic acid for tremor

## 2014-08-16 NOTE — Progress Notes (Signed)
ASSESSMENT: Pt currently experiencing motivation to abstain from using alcohol. Pt needs to continue taking BH medication and refrain from using alcohol. Pt would benefit from psychoeducation and supportive counseling to cope with symptoms of depression and substance abuse.  Stage of Change: action  PLAN: 1. F/U with behavioral health consultant in as needed 2. Psychiatric Medications: Prozac  3. Behavioral recommendation(s):   -Continue abstaining from alcohol -Consider [facility] for substance abuse [facility] -Consider reading over depression materials and putting together depression self-management goals  SUBJECTIVE: Pt. referred by self for f/u for symptoms of depression:  Pt. here for f/u regarding symptoms of depression.  Pt. reports the following symptoms/concerns: Pt states that, in the past, he turned to alcohol to feel more relaxed ; since the last visit, he has been going outside and playing basketball, and that has helped him to feel more relaxed; he states that he no longer has a craving for alcohol, but that he would consider going to alcohol counseling. Duration of problem: <3 weeks Severity: moderate  OBJECTIVE: Orientation & Cognition: Oriented x3. Thought processes normal and appropriate to situation. Mood: appropriate. Affect: appropriate Appearance: appropriate Risk of harm to self or others: no risk of harm to self or others Substance use: alcohol (last use: about 3 weeks ago) Psychiatric medication use: Unchanged from prior contact. Assessments administered: AUDIT- 35  Diagnosis: Alcohol use disorder, severe, dependence CPT Code: F10.20 -------------------------------------------- Other(s) present in the room: Translator, Celene SkeenBelen Watkins  Time spent with patient in exam room: 25 minutes

## 2014-08-16 NOTE — Progress Notes (Signed)
Establish Care F/U Esophageal Varices  Panic attacks, Tired  No Hx Tobacco No ETOH since Jul 22, 2014 Complaining of rash on chest area possible [facility] Pt unsure of name

## 2014-08-16 NOTE — Assessment & Plan Note (Signed)
A: patient no longer drinking ETOH P: Counseling services prozac Thiamine and folic acid

## 2014-08-16 NOTE — Assessment & Plan Note (Signed)
Transiently elevated blood sugar  while on steroids. Normal CBG today and normal A1c.

## 2014-08-16 NOTE — Patient Instructions (Signed)
Mr. Max Lin,  Thank you for coming in today  1. Alcohol and cocaine abuse, in remission Great work not drinking alcohol, continue this Continue prozac Add benadryl at night for waking up and swelling in nose Take thiamine and folic acid for tremor   2. Anemia: checking CBC today  Continue f/u with counseling services  F/u with me in 6 weeks   Abuso de alcohol y cocana, en remisin Aline BrochureGran trabajo no beber alcohol , continuar con este continuar prozac Aadir Benadryl por la noche para despertar e hinchazn en la Verdia Kubanariz Tome tiamina y cido flico para el temblor 2. Anemia: la comprobacin CBC hoy Continuar f / u con servicios de orientacin M / u conmigo en 6 semanas  Dr. Armen PickupFunches

## 2014-08-16 NOTE — Progress Notes (Signed)
   Subjective:    Patient ID: Max Lin, male    DOB: [birth year], 34 y.o.   MRN: [id number] CC: f/u esophageal varices in setting of ETOH abuse, chronic anxiety  Spanish interpreter present  HPI  1. Anxiety: x 1 year in setting of ETOH abuse. No family hx of mood disorder. Father has hx of ETOH abuse. Patient experiencing panic attacks, feeling overwhelmed, feeling pressure. He has not had a drink for nearly one month. He denies hematemesis, melena, hematochezia. He is taking prozac with some GI upset. He wakes up 2 times per night to urinate and with feeling of trouble breathing. Overall symptoms have improved. He did have a panic [facility] last [facility], went to [facility], received what sounds like a benzo via IM.   2. Skin rash: on chest. Since last week after IM injection. No itching. Rash improving. No close contacts with rash.   3. Esophageal varices: taking PPI. No active bleeding. No ETOH. Feel fatigue but also with depression and anxiety symptoms.   Soc hx: former ETOH, cocaine abuse  Review of Systems  Constitutional: Negative for fever and chills.  Psychiatric/Behavioral: Positive for sleep disturbance. The patient is nervous/anxious.        Objective:   Physical Exam BP 104/66 mmHg  Pulse 82  Temp(Src) 98.5 F (36.9 C) (Oral)  Resp 16  Ht 5\' 5"  (1.651 m)  Wt 178 lb (80.74 kg)  BMI 29.62 kg/m2  SpO2 100%  BP Readings from Last 3 Encounters:  08/16/14 104/66  08/02/14 124/74  07/27/14 110/58  General appearance: alert, cooperative and no distress  Nose: turbinates pink and swollen R >L  Lungs: clear to auscultation bilaterally Heart: regular rate and rhythm, S1, S2 normal, no murmur, click, rub or gallop  Abdomen: soft, flat, mild tender in epigastric area w/o rebound or guarding, healed appendectomy scars, no masses  Skin: erythematous papules on upper chest  Neuro: fine intention tremor in hands      Assessment & Plan:

## 2014-08-17 LAB — CBC
HCT: 31.4 % — ABNORMAL LOW (ref 39.0–52.0)
HEMOGLOBIN: 10.1 g/dL — AB (ref 13.0–17.0)
MCH: 23 pg — ABNORMAL LOW (ref 26.0–34.0)
MCHC: 32.2 g/dL (ref 30.0–36.0)
MCV: 71.5 fL — ABNORMAL LOW (ref 78.0–100.0)
PLATELETS: 128 10*3/uL — AB (ref 150–400)
RBC: 4.39 MIL/uL (ref 4.22–5.81)
RDW: 20.6 % — ABNORMAL HIGH (ref 11.5–15.5)
WBC: 9.9 10*3/uL (ref 4.0–10.5)

## 2014-08-17 MED ORDER — FERROUS SULFATE 325 (65 FE) MG PO TABS: 325.0000 mg | ORAL_TABLET | Freq: Two times a day (BID) | ORAL | Status: AC

## 2014-08-17 NOTE — Addendum Note (Signed)
Addended by: Dessa PhiFUNCHES, Fuller Makin on: 08/17/2014 09:10 AM   Modules accepted: Orders

## 2014-08-23 NOTE — Telephone Encounter (Signed)
-----   Message from Jaclyn ShaggyEnobong Amao, MD sent at 08/03/2014  4:55 PM EDT ----- Please inform him that his labs reveal anemia has improved slightly; he has elevated liver enzymes which are trending up and are as a result of alcoholic hepatitis. He will have a repeat at his next office visit.

## 2014-08-23 NOTE — Telephone Encounter (Signed)
Nurse called patient via [employer], interpretor [phone]. No answer and no voicemail.

## 2014-08-24 NOTE — Telephone Encounter (Signed)
Nurse called patient via Max Lin, patient verified date of birth. Patient aware of slightly improved anemia and elevated liver enzymes. Patient aware of elevated enzymes are a result of alcoholic hepatitis. Patient reports not drinking alcohol. Patient agrees to have repeat labs at next office visit. Patient voices understanding and has no questions at this time.

## 2014-08-24 NOTE — Telephone Encounter (Signed)
-----   Message from Jaclyn Shaggy, MD sent at 08/03/2014  4:55 PM EDT ----- Please inform him that his labs reveal anemia has improved slightly; he has elevated liver enzymes which are trending up and are as a result of alcoholic hepatitis. He will have a repeat at his next office visit.

## 2014-08-30 ENCOUNTER — Telehealth: Payer: Self-pay | Admitting: *Deleted

## 2014-08-30 NOTE — Telephone Encounter (Signed)
Unable to contact Pt.no voice mail set up

## 2014-08-30 NOTE — Telephone Encounter (Signed)
Pt aware of results 

## 2014-08-30 NOTE — Telephone Encounter (Signed)
-----   Message from Dessa Phi, MD sent at 08/17/2014  9:09 AM EDT ----- Hgb stable Platelets a bit low Start iron therapy in addition to folic acid and thiamine

## 2014-09-11 ENCOUNTER — Ambulatory Visit: Payer: Self-pay | Attending: Internal Medicine

## 2014-09-27 ENCOUNTER — Telehealth: Payer: Self-pay | Admitting: Family Medicine

## 2014-09-27 ENCOUNTER — Ambulatory Visit: Payer: Self-pay | Admitting: Family Medicine

## 2014-09-27 NOTE — Telephone Encounter (Signed)
Advised to contact pharmacy for refills

## 2014-09-27 NOTE — Telephone Encounter (Signed)
Patient came into office requesting medication refill, unable to provide names of medications. Please f/u with patient

## 2014-10-02 ENCOUNTER — Telehealth: Payer: Self-pay | Admitting: Family Medicine

## 2014-10-02 NOTE — Telephone Encounter (Signed)
Pt calling to report that he is experiencing weakness in knees when walking or climbing stairs.  Pt would like to speak to nurse regarding what to do.

## 2014-10-13 ENCOUNTER — Ambulatory Visit: Payer: Self-pay | Admitting: Family Medicine

## 2015-02-27 ENCOUNTER — Emergency Department (HOSPITAL_COMMUNITY): Payer: Self-pay

## 2015-02-27 ENCOUNTER — Emergency Department (HOSPITAL_COMMUNITY)
Admission: EM | Admit: 2015-02-27 | Discharge: 2015-02-27 | Payer: Self-pay | Attending: Emergency Medicine | Admitting: Emergency Medicine

## 2015-02-27 DIAGNOSIS — D696 Thrombocytopenia, unspecified: Secondary | ICD-10-CM | POA: Insufficient documentation

## 2015-02-27 DIAGNOSIS — K219 Gastro-esophageal reflux disease without esophagitis: Secondary | ICD-10-CM | POA: Insufficient documentation

## 2015-02-27 DIAGNOSIS — F419 Anxiety disorder, unspecified: Secondary | ICD-10-CM | POA: Insufficient documentation

## 2015-02-27 DIAGNOSIS — Z79899 Other long term (current) drug therapy: Secondary | ICD-10-CM | POA: Insufficient documentation

## 2015-02-27 DIAGNOSIS — N481 Balanitis: Secondary | ICD-10-CM | POA: Insufficient documentation

## 2015-02-27 DIAGNOSIS — F17210 Nicotine dependence, cigarettes, uncomplicated: Secondary | ICD-10-CM | POA: Insufficient documentation

## 2015-02-27 DIAGNOSIS — F329 Major depressive disorder, single episode, unspecified: Secondary | ICD-10-CM | POA: Insufficient documentation

## 2015-02-27 DIAGNOSIS — D649 Anemia, unspecified: Secondary | ICD-10-CM | POA: Insufficient documentation

## 2015-02-27 DIAGNOSIS — B084 Enteroviral vesicular stomatitis with exanthem: Secondary | ICD-10-CM | POA: Insufficient documentation

## 2015-02-27 DIAGNOSIS — N50811 Right testicular pain: Secondary | ICD-10-CM

## 2015-02-27 LAB — COMPREHENSIVE METABOLIC PANEL
ALK PHOS: 152 U/L — AB (ref 38–126)
ALT: 57 U/L (ref 17–63)
AST: 90 U/L — AB (ref 15–41)
Albumin: 3.2 g/dL — ABNORMAL LOW (ref 3.5–5.0)
Anion gap: 5 (ref 5–15)
CALCIUM: 8 mg/dL — AB (ref 8.9–10.3)
CHLORIDE: 108 mmol/L (ref 101–111)
CO2: 25 mmol/L (ref 22–32)
CREATININE: 0.67 mg/dL (ref 0.61–1.24)
Glucose, Bld: 101 mg/dL — ABNORMAL HIGH (ref 65–99)
Potassium: 3.6 mmol/L (ref 3.5–5.1)
Sodium: 138 mmol/L (ref 135–145)
Total Bilirubin: 1.6 mg/dL — ABNORMAL HIGH (ref 0.3–1.2)
Total Protein: 5.3 g/dL — ABNORMAL LOW (ref 6.5–8.1)

## 2015-02-27 LAB — CBC WITH DIFFERENTIAL/PLATELET
BASOS PCT: 0 %
Basophils Absolute: 0 10*3/uL (ref 0.0–0.1)
EOS PCT: 28 %
Eosinophils Absolute: 2.1 10*3/uL — ABNORMAL HIGH (ref 0.0–0.7)
HCT: 30.8 % — ABNORMAL LOW (ref 39.0–52.0)
HEMOGLOBIN: 10.3 g/dL — AB (ref 13.0–17.0)
LYMPHS PCT: 8 %
Lymphs Abs: 0.6 10*3/uL — ABNORMAL LOW (ref 0.7–4.0)
MCH: 21.5 pg — AB (ref 26.0–34.0)
MCHC: 33.4 g/dL (ref 30.0–36.0)
MCV: 64.2 fL — AB (ref 78.0–100.0)
MONOS PCT: 16 %
Monocytes Absolute: 1.2 10*3/uL — ABNORMAL HIGH (ref 0.1–1.0)
NEUTROS PCT: 48 %
Neutro Abs: 3.6 10*3/uL (ref 1.7–7.7)
Platelets: 95 10*3/uL — ABNORMAL LOW (ref 150–400)
RBC: 4.8 MIL/uL (ref 4.22–5.81)
RDW: 20.8 % — ABNORMAL HIGH (ref 11.5–15.5)
WBC: 7.5 10*3/uL (ref 4.0–10.5)

## 2015-02-27 LAB — URINALYSIS, ROUTINE W REFLEX MICROSCOPIC
BILIRUBIN URINE: NEGATIVE
Glucose, UA: NEGATIVE mg/dL
KETONES UR: NEGATIVE mg/dL
NITRITE: NEGATIVE
PH: 6.5 (ref 5.0–8.0)
Protein, ur: NEGATIVE mg/dL
SPECIFIC GRAVITY, URINE: 1.007 (ref 1.005–1.030)

## 2015-02-27 LAB — URINE MICROSCOPIC-ADD ON
Bacteria, UA: NONE SEEN
Squamous Epithelial / LPF: NONE SEEN

## 2015-02-27 LAB — RAPID STREP SCREEN (MED CTR MEBANE ONLY): Streptococcus, Group A Screen (Direct): NEGATIVE

## 2015-02-27 MED ORDER — PENICILLIN G BENZATHINE 1200000 UNIT/2ML IM SUSP
2.4000 10*6.[IU] | Freq: Once | INTRAMUSCULAR | Status: AC
  Administered 2015-02-27: 2.4 10*6.[IU] via INTRAMUSCULAR
  Filled 2015-02-27: qty 4

## 2015-02-27 MED ORDER — DEXAMETHASONE SODIUM PHOSPHATE 10 MG/ML IJ SOLN
10.0000 mg | Freq: Once | INTRAMUSCULAR | Status: AC
  Administered 2015-02-27: 10 mg via INTRAVENOUS
  Filled 2015-02-27: qty 1

## 2015-02-27 MED ORDER — SODIUM CHLORIDE 0.9 % IV BOLUS (SEPSIS)
1000.0000 mL | Freq: Once | INTRAVENOUS | Status: AC
  Administered 2015-02-27: 1000 mL via INTRAVENOUS

## 2015-02-27 MED ORDER — THERA VITAL M PO TABS: 1.0000 | ORAL_TABLET | Freq: Every day | ORAL | Status: AC

## 2015-02-27 MED ORDER — LIDOCAINE VISCOUS 2 % MT SOLN: 5.0000 mL | OROMUCOSAL | Status: AC | PRN

## 2015-02-27 MED ORDER — MAGIC MOUTHWASH
5.0000 mL | Freq: Once | ORAL | Status: AC
  Administered 2015-02-27: 5 mL via ORAL
  Filled 2015-02-27: qty 5

## 2015-02-27 MED ORDER — CLOTRIMAZOLE 1 % EX CREA: TOPICAL_CREAM | CUTANEOUS | Status: AC

## 2015-02-27 NOTE — ED Notes (Signed)
Pt alert and oriented x4. Respirations even and unlabored, bilateral symmetrical rise and fall of chest. Skin warm and dry. In no acute distress. Denies needs.  Pt in custody

## 2015-02-27 NOTE — ED Notes (Addendum)
Pt speaks limited [place], Spanish speaking. Through rn limited conversation pt reports genital swelling/pain for 10 days, rash to palms of hands x5 days, and lip swelling and pain x5.  Upon speaking with nurse from jail. Reports pt has had an allergic reaction that started sometime this week.  Pt has been taking naproxen for scrotal protrusion/pain on 12/9,12/10, 12/11, 12/12. Pt given zyrtec 12/12. Naproxen was stopped today 12/13. Pt given 50 mg benadryl IM at 0750 today 12/13. Nurse noted lips were more swollen today and she noticed rash on hands yesterday.   Upon rn assessment pt has rash to bil palms, reports groin swelling. Lips red and chapped.

## 2015-02-27 NOTE — ED Provider Notes (Signed)
CSN: [id number]     Arrival date & time 02/27/15  0903 History   First MD Initiated Contact with Patient 02/27/15 0915     Chief Complaint  Patient presents with  . Allergic Reaction  . Rash     (Consider location/radiation/quality/duration/timing/severity/associated sxs/prior Treatment) HPI Max Lin is a 34 y.o. male who presents to emergency department complaining of swelling to the groin, lips, throat, hands and feet. Patient is currently in jail. Intraoperative phone used, patient is Spanish-speaking. Patient reports swelling to the genital area for approximately 10 days. He was given naproxen for the pain starting on 12/9, and given twice a day daily. Patient states approximately 5 days ago he developed sore throat, sores to the lips and oral mucosa. He also states he developed swelling and pain to bilateral hands and feet. He denies any fever or chills. He states is painful to swallow, eat or drink. He denies any penile discharge.  Past Medical History  Diagnosis Date  . Anxiety   . Depression   . GERD (gastroesophageal reflux disease)    Past Surgical History  Procedure Laterality Date  . Esophagogastroduodenoscopy N/A 07/22/2014    Procedure: ESOPHAGOGASTRODUODENOSCOPY (EGD);  Surgeon: Bernette Redbird, MD;  Location[facility] ENDOSCOPY;  Service: Endoscopy;  Laterality: N/A;   No family history on file. Social History  Substance Use Topics  . Smoking status: Current Some Day Smoker    Types: Cigarettes  . Smokeless tobacco: Not on file  . Alcohol Use: Yes     Comment: last alcohol use 1 week ago    Review of Systems  Constitutional: Negative for fever and chills.  HENT: Positive for mouth sores and sore throat.   Respiratory: Negative for cough, chest tightness and shortness of breath.   Cardiovascular: Negative for chest pain, palpitations and leg swelling.  Gastrointestinal: Negative for nausea, vomiting, abdominal pain, diarrhea and abdominal distention.   Genitourinary: Positive for penile swelling, scrotal swelling, penile pain and testicular pain. Negative for dysuria, urgency, frequency, hematuria and discharge.  Musculoskeletal: Negative for myalgias, arthralgias, neck pain and neck stiffness.  Skin: Positive for rash.  Allergic/Immunologic: Negative for immunocompromised state.  Neurological: Negative for dizziness, weakness, light-headedness, numbness and headaches.      Allergies  Review of patient's allergies indicates no known allergies.  Home Medications   Prior to Admission medications   Medication Sig Start Date End Date Taking? Authorizing Provider  cetirizine (ZYRTEC) 10 MG tablet Take 10 mg by mouth daily.   Yes Historical Provider, MD  diphenhydrAMINE (BENADRYL) 25 MG tablet Take 1 tablet (25 mg total) by mouth at bedtime as needed. 08/16/14  Yes Josalyn Funches, MD  naproxen (NAPROSYN) 250 MG tablet Take 250-500 mg by mouth daily as needed for moderate pain.   Yes Historical Provider, MD  ferrous sulfate 325 (65 FE) MG tablet Take 1 tablet (325 mg total) by mouth 2 (two) times daily with a meal. Patient not taking: Reported on 02/27/2015 08/17/14   Dessa Phi, MD  FLUoxetine (PROZAC) 40 MG capsule Take 1 capsule (40 mg total) by mouth daily. Patient not taking: Reported on 02/27/2015 08/02/14   Jaclyn Shaggy, MD  folic acid (FOLVITE) 1 MG tablet Take 1 tablet (1 mg total) by mouth daily. Patient not taking: Reported on 02/27/2015 08/16/14   Dessa Phi, MD  pantoprazole (PROTONIX) 40 MG tablet Take 1 tablet (40 mg total) by mouth daily at 12 noon. Patient not taking: Reported on 02/27/2015 08/16/14   Dessa Phi, MD  thiamine (VITAMIN B-1) 100 MG tablet Take 1 tablet (100 mg total) by mouth daily. Patient not taking: Reported on 02/27/2015 08/16/14   Josalyn Funches, MD   BP 114/73 mmHg  Pulse 85  Temp(Src) 98.6 F (37 C) (Oral)  Resp 16  SpO2 100% Physical Exam  Constitutional: He appears well-developed and  well-nourished. No distress.  HENT:  Head: Normocephalic and atraumatic.  Lips are swollen, erythematous, scaly, multiple ulcerations to the oral mucosa of the lips and buccal mucosa. Oropharynx erythematous.  Eyes: Conjunctivae are normal.  Neck: Neck supple.  Cardiovascular: Normal rate, regular rhythm and normal heart sounds.   Pulmonary/Chest: Effort normal. No respiratory distress. He has no wheezes. He has no rales.  Abdominal: Soft. Bowel sounds are normal. He exhibits no distension. There is no tenderness. There is no rebound.  Genitourinary:  Erythematous, macerated rash around the distal penis at the glans. Several ulcerations noted. No vesicles. Tender to palpation. No rash over the skin of the scrotum or proximal penis. No penile drainage. Normal left testicle. Enlarged and tender to palpation right testicle.  Musculoskeletal: He exhibits no edema.  Neurological: He is alert.  Skin: Skin is warm and dry.  Erythematous scaly rash to bilateral cheeks and will lead. Erythematous blanching rash to bilateral hands and feet. Tender to palpation. No vesicles or ulcerations. Hyperpigmented, scaly rash to bilateral groin.  Nursing note and vitals reviewed.   ED Course  Procedures (including critical care time) Labs Review Labs Reviewed  CBC WITH DIFFERENTIAL/PLATELET - Abnormal; Notable for the following:    Hemoglobin 10.3 (*)    HCT 30.8 (*)    MCV 64.2 (*)    MCH 21.5 (*)    RDW 20.8 (*)    Platelets 95 (*)    Lymphs Abs 0.6 (*)    Monocytes Absolute 1.2 (*)    Eosinophils Absolute 2.1 (*)    All other components within normal limits  COMPREHENSIVE METABOLIC PANEL - Abnormal; Notable for the following:    Glucose, Bld 101 (*)    BUN <5 (*)    Calcium 8.0 (*)    Total Protein 5.3 (*)    Albumin 3.2 (*)    AST 90 (*)    Alkaline Phosphatase 152 (*)    Total Bilirubin 1.6 (*)    All other components within normal limits  URINALYSIS, ROUTINE W REFLEX MICROSCOPIC (NOT AT  [facility]) - Abnormal; Notable for the following:    APPearance CLOUDY (*)    Hgb urine dipstick SMALL (*)    Leukocytes, UA SMALL (*)    All other components within normal limits  RAPID STREP SCREEN (NOT AT [facility])  CULTURE, GROUP A STREP  URINE CULTURE  URINE MICROSCOPIC-ADD ON  RPR  HIV ANTIBODY (ROUTINE TESTING)  HSV 1 ANTIBODY, IGG  HSV 2 ANTIBODY, IGG  GC/CHLAMYDIA PROBE AMP ([place]) NOT AT [facility]    Imaging Review Kore[place] Scrotum  02/27/2015  CLINICAL DATA:  Right testicular pain, swelling for 10 days. EXAM: ULTRASOUND OF SCROTUM DOPPLER INTERROGATION OF THE TESTES TECHNIQUE: Complete ultrasound examination of the testicles, epididymis, and other scrotal structures was performed. Doppler interrogation was also performed of the testes bilaterally. COMPARISON:  None. FINDINGS: Right testicle Measurements: 3.9 x 2.7 x 2.8 cm. No mass or microlithiasis visualized. Left testicle Measurements: 4.1 x 3.0 x 3.7 cm. No mass or microlithiasis visualized. Right epididymis:  Normal in size and appearance. Left epididymis:  Normal in size and appearance. Hydrocele:  Large hydrocele on the right.  Varicocele:  None visualized. Doppler: Normal low pressure arterial and venous waveforms noted within the testes bilaterally IMPRESSION: Negative. No evidence for testicular mass or other significant abnormality. Electronically Signed   By: Charlett Nose M.D.   On: 02/27/2015 12:05   [place] Art/ven Flow Abd Pelv Doppler  02/27/2015  CLINICAL DATA:  Right testicular pain, swelling for 10 days. EXAM: ULTRASOUND OF SCROTUM DOPPLER INTERROGATION OF THE TESTES TECHNIQUE: Complete ultrasound examination of the testicles, epididymis, and other scrotal structures was performed. Doppler interrogation was also performed of the testes bilaterally. COMPARISON:  None. FINDINGS: Right testicle Measurements: 3.9 x 2.7 x 2.8 cm. No mass or microlithiasis visualized. Left testicle Measurements: 4.1 x 3.0 x 3.7 cm. No mass or  microlithiasis visualized. Right epididymis:  Normal in size and appearance. Left epididymis:  Normal in size and appearance. Hydrocele:  Large hydrocele on the right. Varicocele:  None visualized. Doppler: Normal low pressure arterial and venous waveforms noted within the testes bilaterally IMPRESSION: Negative. No evidence for testicular mass or other significant abnormality. Electronically Signed   By: Charlett Nose M.D.   On: 02/27/2015 12:05   I have personally reviewed and evaluated these images and lab results as part of my medical decision-making.   EKG Interpretation None      MDM   Final diagnoses:  Hand, foot and mouth disease  Anemia, unspecified anemia type  Thrombocytopenia (HCC)  Balanitis    Patient emergency department complaining of scrotal pain, rash to the penis, rash to the lips and mouth, pain and rash to the hands and feet. Pt is afebrile. Normal vital signs. Pt is currently in prison. No new medications or products to suspect steven johnsons. Will get albs, will get STD panel, strep screen, [place] of scrotum. IV fluids started.   Blood pressure dropped into the 80s and low 90s systolic. Fluids are running. Magic mouth wash ordered for stomatitis.   1:00 PM Work showed anemia and thrombocytopenia which appears chronic. Patient does have long-standing history of alcohol abuse. Advised to follow-up with primary care doctor. Ultrasound of scrotum negative. Urine does not show any signs of infection, culture sent. Strep is negative. Discussed with Dr. Patria Mane, who has seen patient as well, suspect hand-foot-and-mouth disease. Will treat symptomatically. Will also cover her with Bicillin 2.4 million units for possible syphilis, RPR is pending. GC chlamydia, HIV also pending. BP still 80s and 90s systolic. Will ambulate and see if symptomatic  2:00 PM Pt ambulated in hallway. Feels well, no dizziness. BP back in 100s systolic. Interpreter phone used to discuss all results and  plan. Patient voiced understanding. All questions were answered. Return precautions discussed. Patient will be started on multivitamin,  Jaynie Crumble, PA-C 02/27/15 1550  Azalia Bilis, MD 02/27/15 [phone]

## 2015-02-27 NOTE — ED Notes (Signed)
US at bedside

## 2015-02-27 NOTE — Discharge Instructions (Signed)
Use lidocaine as needed for mouth pain. Multivitamins daily for anemia, will need close follow-up with primary care doctor for recheck. Lotrimin cream to the penis twice a day.  Most likely hand-foot-and-mouth disease, should resolve on its own, however some blood work still pending and we will contact her if it comes back abnormal. Return to the hospital if worsening symptoms. Scrotal US normal.   Estomatitis (Stomatitis) La estomatitis es una afeccin que causa inflamacin en la boca. Puede afectar una parte de la boca o la boca entera. Con frecuencia, afecta la mejilla, los dientes, las encas, los labios y Scientist, product/process development. La estomatitis tambin [employer] que estn alrededor de la boca. El dolor de la estomatitis puede dificultar la accin de comer o beber. Los [place]orne graves de esta afeccin pueden causar deshidratacin o nutricin deficiente. CAUSAS Las causas ms frecuentes de esta afeccin incluyen las siguientes:  Virus, como llagas peribucales o herpes labial y [place].  Aftas.  Infecciones bacterianas.  Infecciones por hongos o levaduras, como candidiasis oral.  Falta de nutricin suficiente.  Una lesin en la boca. Esta puede ocurrir por los siguientes motivos:  Tener una dentadura postiza o un Investment banker, operational que no queda bien.  Morderse la lengua o la [place].  Quemarse la boca.  Tener los dientes filosos o rotos.  Enfermedad de las encas.  Consumo de tabaco, especialmente, tabaco para Theatre manager.  Alergia a alimentos, medicamentos o sustancias que se International aid/development worker.  Medicamentos, entre ellos, medicamentos para el cncer (quimioterapia), antihistamnicos y medicamentos para las convulsiones. En algunos casos, es posible que la causa no se conozca. FACTORES DE RIESGO Es ms probable que esta afeccin se manifieste en las personas que:  Tienen mala higiene bucal o nutricin deficiente.  Tienen una afeccin que causa sequedad en la boca.  Estn  bajo mucho estrs emocional o fsico.  Tienen una afeccin que debilita el sistema de defensa del cuerpo (sistema inmunitario).  Reciben tratamiento para el cncer.  Fuman. SNTOMAS Los sntomas ms frecuentes de esta afeccin son dolor, hinchazn y enrojecimiento dentro de la boca. El dolor puede sentirse como ardor o Engineer, petroleum. Puede empeorar al comer o beber. Otros sntomas pueden ser los siguientes:  Llagas (lceras) dolorosas y superficiales en la boca.  Ampollas en la boca.  Encas sangrantes.  Inflamacin de las encas.  Irritabilidad y fatiga.  Mal aliento.  Mal sabor en la boca.  Grant Ruts. DIAGNSTICO Esta afeccin se diagnostica mediante un examen fsico que confirma si hay encas sangrantes y lceras en la boca. Tambin pueden hacerle otros estudios, por ejemplo:  Anlisis de sangre para Engineer, manufacturing una infeccin o falta de vitaminas.  Un hisopado bucal para obtener [place] de lquido y Chiropractor las bacterias (cultivo).  Extraccin de [place] de tejido de una lcera para examinar con un microscopio (biopsia). TRATAMIENTO El tratamiento de la estomatitis depende de la causa. El tratamiento puede incluir medicamentos, por ejemplo:  Analgsicos de [employer].  Un anestsico tpico para adormecer el rea si tiene dolor intenso.  Antibiticos para tratar una infeccin bacteriana.  Antimicticos para tratar una infeccin por hongos.  Antivirales para tratar Neomia Dear infeccin viral.  Enjuagues bucales con corticoides para reducir la hinchazn de la boca.  Otros medicamentos que forman una capa en la boca o la adormecen. INSTRUCCIONES PARA EL CUIDADO EN EL HOGAR Medicamentos  Tome los medicamentos solamente como se lo haya indicado el mdico.  Si le recetaron un antibitico, asegrese de terminarlo aunque comience a  sentirse mejor. Estilo de vida  Mantenga una buena higiene bucal:  Cepllese suavemente los dientes con un cepillo dental de cerdas suaves de  nailon dosveces al [employer].  Utilice hilo dental [employer].  Hgase una limpieza dental peridica como se lo haya recomendado el dentista.  Consuma una dieta equilibrada.No coma lo siguiente:  Comidas muy condimentadas.  Ctricos, como naranjas.  Alimentos con bordes filosos, como papas fritas de [place].  Evite los alimentos u otros alrgenos de los que sospeche que le pueden estar causando la estomatitis.  Si tiene Database administrator, asegrese de que le quede correctamente.  No consuma ningn producto que contenga tabaco, lo que incluye cigarrillos, tabaco de Theatre manager o Administrator, Civil Service. Si necesita ayuda para dejar de fumar, consulte al mdico.  Busque maneras de reducir Development worker, community. Pruebe hacer yoga o meditacin. Consulte al mdico para que le d otras ideas. Instrucciones generales  Para el dolor, use un enjuague de agua salada como se lo haya indicado el mdico. Mezcle 1cucharadita de sal en 2tazas de agua.  Beba suficiente lquido para Photographer orina clara o de color amarillo plido. Esto lo mantendr hidratado. SOLICITE ATENCIN MDICA SI:  Los sntomas empeoran.  Le aparecen sntomas nuevos, en especial, los siguientes:  Una erupcin.  Sntomas nuevos que no afectan la zona de la boca.  Los sntomas se prolongan ms de tres semanas.  La estomatitis desaparece pero luego regresa.  Le cuesta ms comer y beber normalmente.  Est cada vez ms fatigado o dbil.  Pierde el apetito o tiene nuseas.  Tiene fiebre.   Esta informacin no tiene Theme park manager el consejo del mdico. Asegrese de hacerle al mdico cualquier pregunta que tenga.   Document Released: 06/19/2008 Document Revised: 07/18/2014 Elsevier Interactive Patient Education 2016 [employer].  Balanitis (Balanitis) La balanitis es la inflamacin de la cabeza del pene (glande).  CAUSAS  Puede tener mltiples causas, tanto infecciosas como no infecciosas. Con frecuencia, la  balanitis es el resultado de una higiene personal deficiente, especialmente en los hombres no circuncidados. Sin una higiene [place], los virus, las bacterias y los hongos se acumulan entre el prepucio y el glande. Esto puede ocasionar una infeccin. La falta de aire y la irritacin debido a la secrecin normal llamada esmegma contribuyen al problema en los hombres no circuncidados. Otras causas son:  Irritacin qumica por el uso de algunos jabones y geles de ducha (especialmente jabones con perfume), condones, lubricantes personales, vaselina, espermicidas y acondicionadores de telas.  Enfermedades de la piel, como el eczema, la dermatitis y la psoriasis.  Alergias a algunos frmacos, como tetraciclina y sulfas.  Algunas enfermedades como la cirrosis heptica, insuficiencia cardaca congestiva y enfermedades renales.  Obesidad mrbida. FACTORES DE RIESGO  Diabetes mellitus.  Un prepucio apretado que es difcil de tirar hacia atrs hasta pasar el glande (fimosis).  Tener relaciones sexuales sin usar un condn. Blake Divine SNTOMAS  Los sntomas pueden ser:  Secrecin que proviene de la zona debajo del prepucio.  Sensibilidad.  Picazn e imposibilidad para Landscape architect ereccin (debido al dolor).  Irritacin y erupcin cutnea.  Llagas en el glande y el prepucio. DIAGNSTICO El diagnstico de balanitis se realiza a travs de un examen fsico. TRATAMIENTO El tratamiento se basa en la causa. El tratamiento puede incluir:  Lavados frecuentes.  Mantener el glande y el prepucio secos.  El uso de medicamentos, como cremas, Clinical research associate, antibiticos o medicamentos para tratar infecciones por hongos.  Baos de asiento. Si la irritacin est  originada en una cicatriz del prepucio que impide una retraccin fcil, se recomienda una circuncisin.  INSTRUCCIONES PARA EL CUIDADO EN EL HOGAR  Debe evitar las relaciones sexuales hasta que la afeccin se haya mejorado. ASEGRESE DE  QUE:  Comprende estas instrucciones.  Controlar su afeccin.  Recibir ayuda de inmediato si no mejora o si empeora.   Esta informacin no tiene Theme park managercomo fin reemplazar el consejo del mdico. Asegrese de hacerle al mdico cualquier pregunta que tenga.   Document Released: 11/03/2012 Document Revised: 03/08/2013 Elsevier Interactive Patient Education [employer]2016 Elsevier Inc.

## 2015-02-28 LAB — GC/CHLAMYDIA PROBE AMP (~~LOC~~) NOT AT ARMC
CHLAMYDIA, DNA PROBE: NEGATIVE
Neisseria Gonorrhea: NEGATIVE

## 2015-02-28 LAB — URINE CULTURE

## 2015-02-28 LAB — HSV 2 ANTIBODY, IGG: HSV 2 Glycoprotein G Ab, IgG: <0.91 index (ref 0.00–0.90)

## 2015-02-28 LAB — RPR: RPR Ser Ql: NONREACTIVE

## 2015-02-28 LAB — HIV ANTIBODY (ROUTINE TESTING W REFLEX): HIV Screen 4th Generation wRfx: NONREACTIVE

## 2015-02-28 LAB — HSV 1 ANTIBODY, IGG: HSV 1 Glycoprotein G Ab, IgG: 38 index — ABNORMAL HIGH (ref 0.00–0.90)

## 2015-03-02 LAB — CULTURE, GROUP A STREP: Strep A Culture: NEGATIVE

## 2016-12-13 IMAGING — CR DG CHEST 1V PORT
1 series · 1 of 1 positions shown · non-contrast
Comparison: None.

CLINICAL DATA: Central line placement

EXAM:
PORTABLE CHEST - 1 VIEW

[AP]
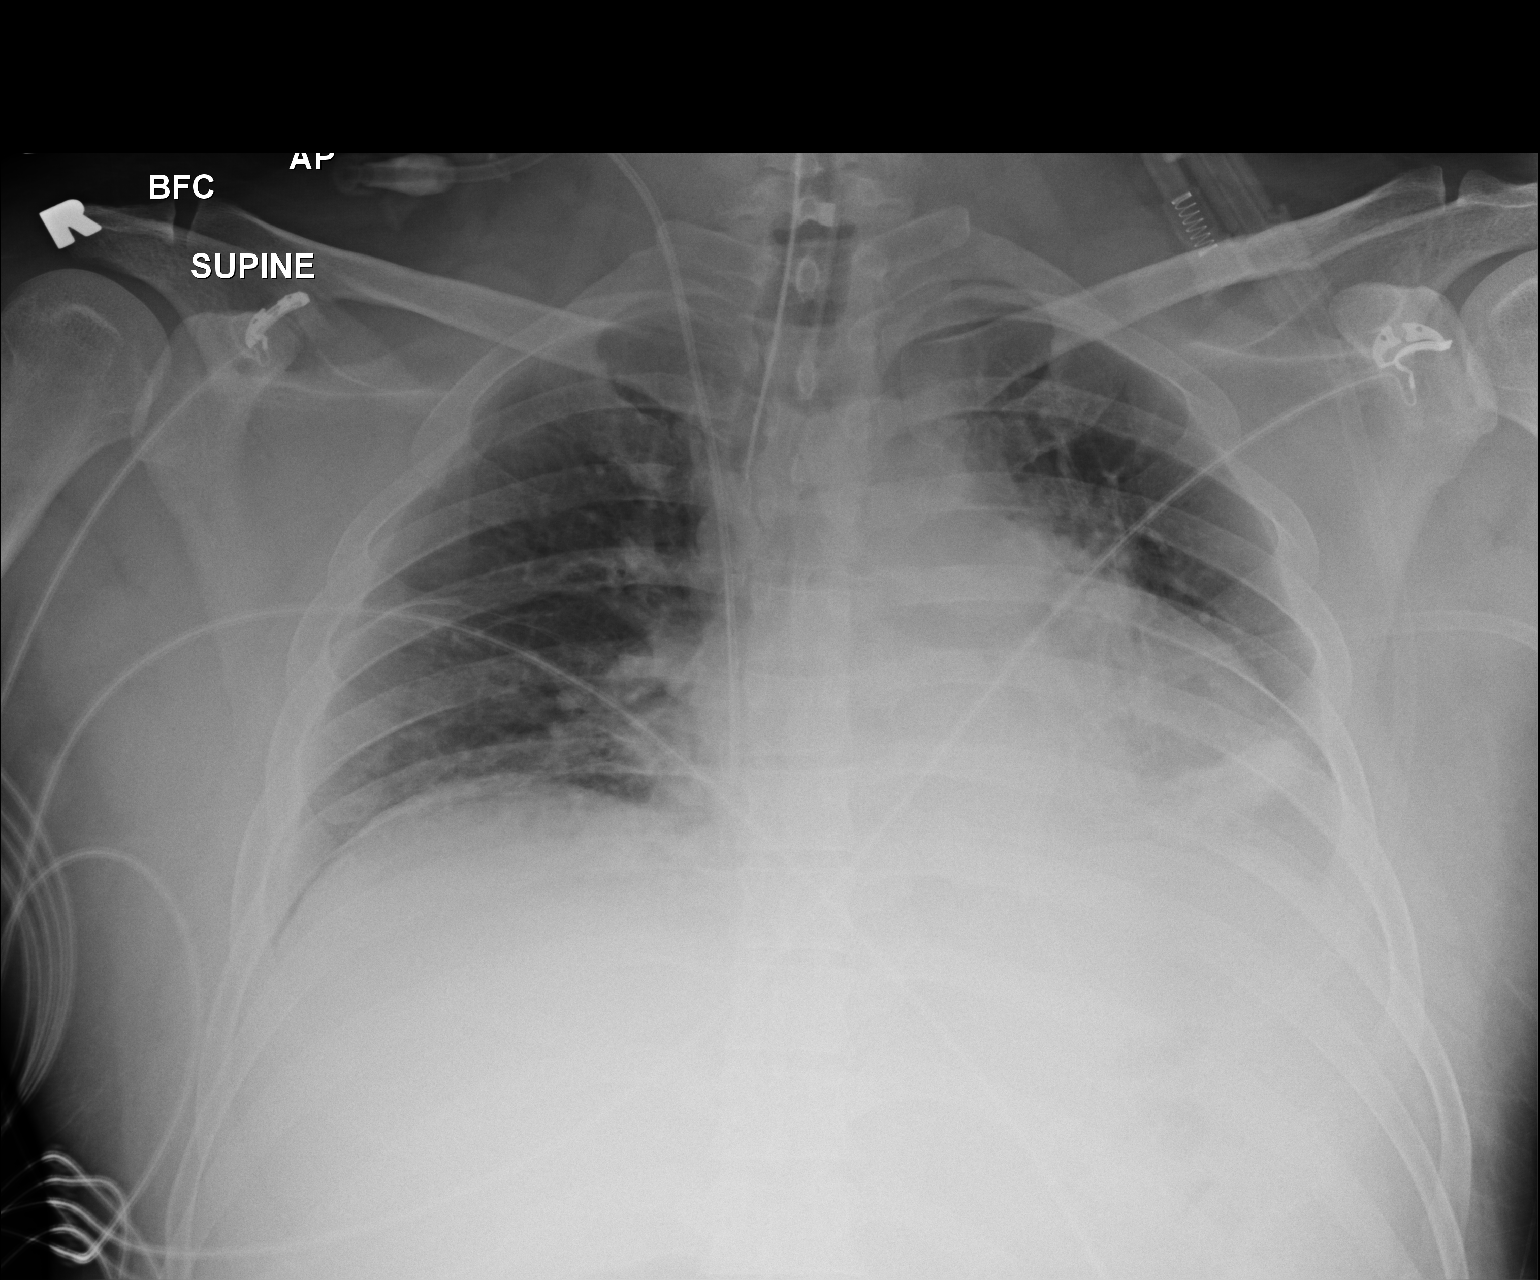

[1 of 1 positions shown; findings below may reference images not displayed]

FINDINGS: Endotracheal tube tip is 2 cm above the carina. There is a right
jugular central line extending into the right atrium. There is no
pneumothorax. There is left base consolidation. The right lung is
clear.
IMPRESSION: Support equipment appears satisfactorily positioned.

No pneumothorax.

Left base consolidation

## 2016-12-14 IMAGING — CR DG CHEST 1V PORT
1 series · 1 of 1 positions shown · non-contrast
Comparison: 07/23/2014 .

CLINICAL DATA: Respiratory failure.

EXAM:
PORTABLE CHEST - 1 VIEW

[AP]
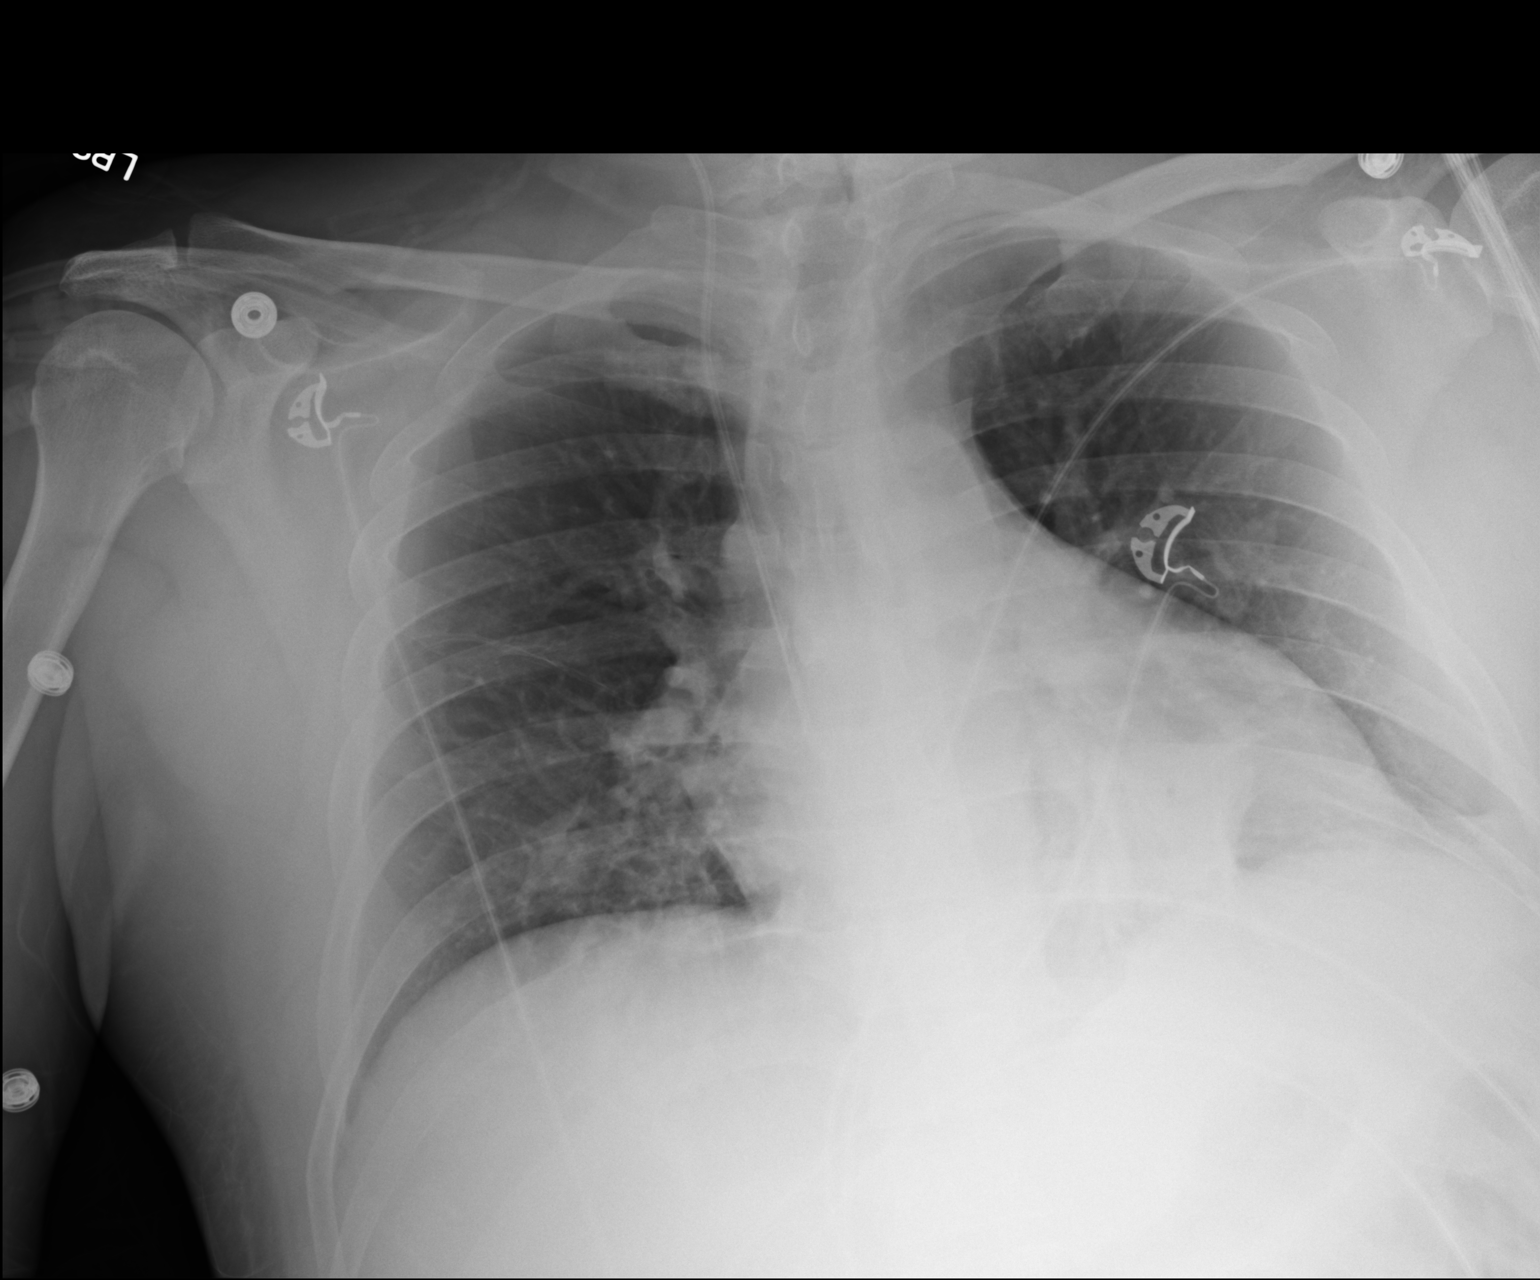

[1 of 1 positions shown; findings below may reference images not displayed]

FINDINGS: Endotracheal tube has been removed. Right IJ line in stable
position. Persistent atelectasis and or infiltrates in the lung
bases, particularly left lung base. Small left pleural effusion. No
pneumothorax. Stable cardiomegaly .
IMPRESSION: 1. Interim extubation.  Right IJ line in stable position.
2. Persistent bibasilar atelectasis and/or infiltrates, particularly
prominent in the left lung base. Small left pleural effusion.

## 2016-12-14 IMAGING — CR DG ABD PORTABLE 1V
1 series · 1 of 1 positions shown · non-contrast
Comparison: None.

CLINICAL DATA: Upper abdominal pain

EXAM:
PORTABLE ABDOMEN - 1 VIEW

[AP]
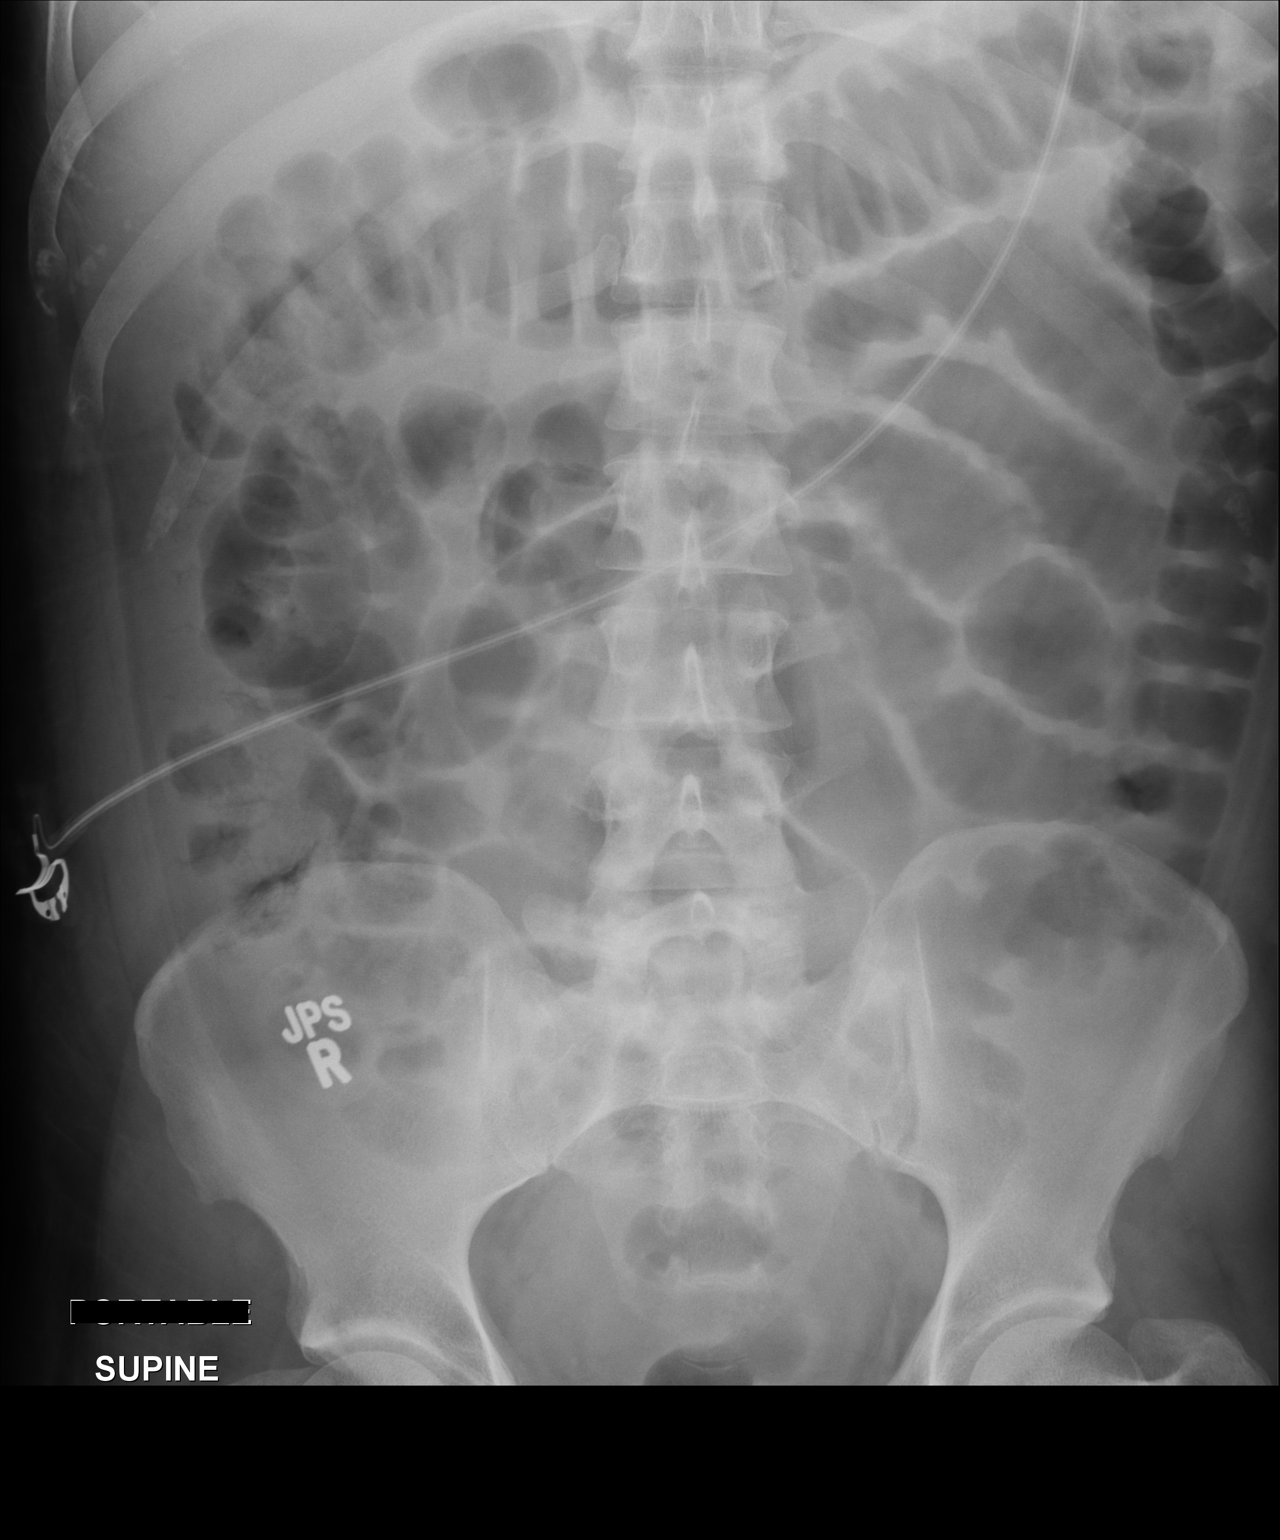

[1 of 1 positions shown; findings below may reference images not displayed]

FINDINGS: Air is seen throughout the small and large bowel. There is no
appreciable bowel obstruction. No air-fluid levels. No free air is
seen on this supine examination. There is some slight wall
thickening in multiple loops of small bowel. This appearance raises
question of a degree of enteritis. There is a tiny phlebolith in mid
right pelvis.
IMPRESSION: Suspect a degree of enteritis. No bowel obstruction or free air
appreciable on this supine examination.
